# Patient Record
Sex: Male | Born: 1935 | Race: White | Hispanic: No | Marital: Married | Smoking: Never smoker
Health system: Southern US, Community
[De-identification: ages and names within clinical notes are randomized; demographics above are authoritative.]

## PROBLEM LIST (undated history)

## (undated) HISTORY — PX: PROSTATE BIOPSY: SHX241

---

## 2001-03-10 ENCOUNTER — Ambulatory Visit (HOSPITAL_COMMUNITY): Admission: RE | Admit: 2001-03-10 | Discharge: 2001-03-10 | Payer: Self-pay | Admitting: Orthopedic Surgery

## 2001-03-10 ENCOUNTER — Encounter: Payer: Self-pay | Admitting: Orthopedic Surgery

## 2004-08-28 ENCOUNTER — Ambulatory Visit: Payer: Self-pay | Admitting: Family Medicine

## 2004-12-09 ENCOUNTER — Ambulatory Visit: Payer: Self-pay | Admitting: Family Medicine

## 2004-12-24 ENCOUNTER — Ambulatory Visit: Payer: Self-pay | Admitting: Internal Medicine

## 2005-01-07 ENCOUNTER — Ambulatory Visit: Payer: Self-pay | Admitting: Internal Medicine

## 2005-01-07 LAB — HM COLONOSCOPY: HM Colonoscopy: ABNORMAL

## 2005-02-08 ENCOUNTER — Ambulatory Visit: Payer: Self-pay | Admitting: Internal Medicine

## 2005-08-03 ENCOUNTER — Ambulatory Visit: Payer: Self-pay | Admitting: Family Medicine

## 2005-10-04 ENCOUNTER — Ambulatory Visit: Payer: Self-pay | Admitting: Family Medicine

## 2006-08-17 ENCOUNTER — Ambulatory Visit: Payer: Self-pay | Admitting: Family Medicine

## 2007-08-18 ENCOUNTER — Ambulatory Visit: Payer: Self-pay | Admitting: Family Medicine

## 2008-07-23 ENCOUNTER — Ambulatory Visit: Payer: Self-pay | Admitting: Family Medicine

## 2008-08-06 ENCOUNTER — Encounter (INDEPENDENT_AMBULATORY_CARE_PROVIDER_SITE_OTHER): Payer: Self-pay | Admitting: Internal Medicine

## 2008-08-14 ENCOUNTER — Encounter (INDEPENDENT_AMBULATORY_CARE_PROVIDER_SITE_OTHER): Payer: Self-pay | Admitting: Internal Medicine

## 2008-08-14 DIAGNOSIS — R972 Elevated prostate specific antigen [PSA]: Secondary | ICD-10-CM | POA: Insufficient documentation

## 2008-08-29 ENCOUNTER — Encounter (INDEPENDENT_AMBULATORY_CARE_PROVIDER_SITE_OTHER): Payer: Self-pay | Admitting: Internal Medicine

## 2009-07-18 ENCOUNTER — Telehealth (INDEPENDENT_AMBULATORY_CARE_PROVIDER_SITE_OTHER): Payer: Self-pay | Admitting: Internal Medicine

## 2009-07-21 ENCOUNTER — Encounter (INDEPENDENT_AMBULATORY_CARE_PROVIDER_SITE_OTHER): Payer: Self-pay | Admitting: Internal Medicine

## 2009-07-22 ENCOUNTER — Ambulatory Visit: Payer: Self-pay | Admitting: Family Medicine

## 2009-07-22 DIAGNOSIS — K219 Gastro-esophageal reflux disease without esophagitis: Secondary | ICD-10-CM | POA: Insufficient documentation

## 2009-08-12 ENCOUNTER — Encounter (INDEPENDENT_AMBULATORY_CARE_PROVIDER_SITE_OTHER): Payer: Self-pay | Admitting: Internal Medicine

## 2009-09-10 ENCOUNTER — Encounter (INDEPENDENT_AMBULATORY_CARE_PROVIDER_SITE_OTHER): Payer: Self-pay | Admitting: Internal Medicine

## 2009-09-21 ENCOUNTER — Encounter (INDEPENDENT_AMBULATORY_CARE_PROVIDER_SITE_OTHER): Payer: Self-pay | Admitting: Internal Medicine

## 2010-01-08 ENCOUNTER — Ambulatory Visit: Payer: Self-pay | Admitting: Family Medicine

## 2010-01-08 DIAGNOSIS — R040 Epistaxis: Secondary | ICD-10-CM | POA: Insufficient documentation

## 2010-02-23 ENCOUNTER — Telehealth: Payer: Self-pay | Admitting: Family Medicine

## 2010-02-25 ENCOUNTER — Telehealth: Payer: Self-pay | Admitting: Family Medicine

## 2010-03-26 ENCOUNTER — Encounter: Payer: Self-pay | Admitting: Family Medicine

## 2010-06-23 ENCOUNTER — Telehealth (INDEPENDENT_AMBULATORY_CARE_PROVIDER_SITE_OTHER): Payer: Self-pay | Admitting: *Deleted

## 2010-06-24 ENCOUNTER — Telehealth: Payer: Self-pay | Admitting: Family Medicine

## 2010-09-08 ENCOUNTER — Ambulatory Visit: Payer: Self-pay | Admitting: Family Medicine

## 2010-11-17 NOTE — Assessment & Plan Note (Signed)
Summary: 30 MIN APPT/Jacob Gray'S PT/CLE   Vital Signs:  Patient profile:   77 year old male Height:      69.25 inches Weight:      182 pounds BMI:     26.78 Temp:     98.6 degrees F oral Pulse rate:   76 / minute Pulse rhythm:   regular BP sitting:   132 / 76  (left arm) Cuff size:   large  Vitals Entered By: Delilah Shan CMA Duncan Dull) (January 08, 2010 3:10 PM) CC: 30 min. appt. (BDB)   History of Present Illness: 77 yo very pleasant male new  to me here to discuss two issues.  1.  Nose bleeds- right side of nose bled every day last week. Would only last for a few minutes but would return every day. BP was always stable and never felt dizzy. Has been on Avodart, so stopped taking it due to nosebleed risk. Last nose bleed was Tuesday, which was after he stopped the Avodart. He wants to know if he should restart it.  2. GERD- used to be on Aciphex for several months to severe reflux. Would wake up 2 am every morning, coughing with burning in throat. Jacob Gray asked him stop taking it.  Last 3 weeks, started having these symptoms again. Tums not helping.  Current Medications (verified): 1)  Tums 500 Mg Chew (Calcium Carbonate Antacid) .... As Needed 2)  Aciphex 20 Mg Tbec (Rabeprazole Sodium) .... Take 1 Tab Every Morning  Allergies (verified): No Known Drug Allergies  Review of Systems      See HPI General:  Denies chills, fatigue, and fever. GI:  Complains of indigestion; denies abdominal pain, bloody stools, gas, hemorrhoids, loss of appetite, nausea, vomiting, vomiting blood, and yellowish skin color.  Physical Exam  General:  alert, well-developed, well-nourished, and well-hydrated.  Thin Nose:  right mucosal blood vessel, appears ruptured, not actively bleeding. Lungs:  normal respiratory effort, no intercostal retractions, no accessory muscle use, and normal breath sounds.   Heart:  normal rate, regular rhythm, and no murmur.   Abdomen:  soft, non-tender, normal bowel  sounds, no distention, no masses, no guarding, no abdominal hernia, no inguinal hernia, no hepatomegaly, and no splenomegaly.   Psych:  normally interactive and good eye contact.     Impression & Recommendations:  Problem # 1:  GERD (ICD-530.81) Assessment Deteriorated  Time spent with patient 25 minutes, more than 50% of this time was spent counseling patient on risks of prolonged PPI, H2 blocker use. We have samples of Dexliant which I gave him since most of his symptoms are waking him up at night. Also given a handout discussing risks of prolonged hypergastrinemia, the possible association of PPIs with gastric atrophy, and the effects of chronic hypochlorhydria. A possible increased risk of fractures prompted the FDA to recommend that healthcare professionals who prescribe proton pump inhibitors should consider whether a lower dose or shorter duration of therapy would adequately treat the patient's condition. He is very symptomatic at this point and I feel the risks outweigh the benefits.  Pt will try short course of dexliant and follow up with me in 1 month. The following medications were removed from the medication list:    Aciphex 20 Mg Tbec (Rabeprazole sodium) .Marland Kitchen... Take 1 tablet by mouth every morning His updated medication list for this problem includes:    Tums 500 Mg Chew (Calcium carbonate antacid) .Marland Kitchen... As needed    Aciphex 20 Mg Tbec (Rabeprazole sodium) .Marland Kitchen... Take  1 tab every morning  Orders: Prescription Created Electronically [phone])  Problem # 2:  NOSEBLEED (ICD-784.7) Assessment: New Appears stable.  Epistaxis appears in less than 0.2% of population taking Avodart.  Recommended restarting it and if nose bleeds recur, we can cauterize the vessel.  Complete Medication List: 1)  Tums 500 Mg Chew (Calcium carbonate antacid) .... As needed 2)  Aciphex 20 Mg Tbec (Rabeprazole sodium) .... Take 1 tab every morning Prescriptions: ACIPHEX 20 MG TBEC (RABEPRAZOLE SODIUM) Take  1 tab every morning  #90 x 3   Entered and Authorized by:   Ruthe Mannan MD   Signed by:   Ruthe Mannan MD on 01/08/2010   Method used:   Elect[facility]ically to        [facility]  [place] (retail)       [address]       [place], [place]  [address]       Ph: [phone] or [phone]       Fax: [phone]   RxID:   [id number]   Current Allergies (reviewed today): No known allergies

## 2010-11-17 NOTE — Letter (Signed)
Summary: [facility]  [facility]   Imported By: Lanelle Bal 04/08/2010 10:04:40  _____________________________________________________________________  External Attachment:    Type:   Image     Comment:   External Document

## 2010-11-17 NOTE — Progress Notes (Signed)
Summary: Rx Omeprazole  Phone Note Call from Patient Call back at [phone]   Caller: Patient Call For: Ruthe Mannan MD Summary of Call: Patient stopped by the office and left a request for a refill on his acid reflux medication. Called patient to verify which medication he is taking and was informed that he is now on Omeprazole which is working for him. Was informed by patient that the Nexium did not work for him. Patient would like a new rx sent to pharmacy for Omeprazole. [facility]acy/[facility]  Patient is aware that Dr. Dayton Martes is out today. Initial call taken by: Sydell Axon LPN,  June 24, 2010 12:29 PM    New/Updated Medications: OMEPRAZOLE 20 MG CPDR (OMEPRAZOLE) one by mouth daily Prescriptions: OMEPRAZOLE 20 MG CPDR (OMEPRAZOLE) one by mouth daily  #90 x 3   Entered and Authorized by:   Ruthe Mannan MD   Signed by:   Ruthe Mannan MD on 06/24/2010   Method used:   Electronically to        [facility]* (retail)       [address]       [place], [place]  [address]       Ph: [phone]       Fax: [phone]   RxID:   [id number]

## 2010-11-17 NOTE — Assessment & Plan Note (Signed)
Summary: FLU SHOT/CLE  Nurse Visit   Allergies: No Known Drug Allergies  Orders Added: 1)  Admin 1st Vaccine [90471] 2)  Flu Vaccine 9yrs + [74259]                  Flu Vaccine Consent Questions     Do you have a history of severe allergic reactions to this vaccine? no    Any prior history of allergic reactions to egg and/or gelatin? no    Do you have a sensitivity to the preservative Thimersol? no    Do you have a past history of Guillan-Barre Syndrome? no    Do you currently have an acute febrile illness? no    Have you ever had a severe reaction to latex? no    Vaccine information given and explained to patient? yes    Are you currently pregnant? no    Lot Number:[id number]   Exp Date:04/17/2011   Site Given  Left Deltoid IMu1

## 2010-11-17 NOTE — Progress Notes (Signed)
Summary: Prior Authorization Aciphex  Phone Note From Pharmacy Call back at ph [phone] fax [phone]   Caller: [facility]* Call For: Dr. Dayton Martes  Summary of Call: Received faxed form from pharmacy stating that PA is needed for Aciphex 20mg .  Called [employer] at [phone] to request PA form, case ID [id number].  Linde Gillis CMA Duncan Dull)  Feb 23, 2010 3:41 PM   Received PA form, in your IN box.   Initial call taken by: Linde Gillis CMA Duncan Dull),  Feb 23, 2010 4:40 PM  Follow-up for Phone Call        In my box. Ruthe Mannan MD  Feb 24, 2010 8:06 AM  Form faxed to [employer] at [phone].    Follow-up by: Linde Gillis CMA Duncan Dull),  Feb 24, 2010 8:35 AM     Appended Document: Prior Authorization Aciphex Received Denial for Aciphex.  Nexium and Omeprazole is covered under the patients plan.  Denial letter in your IN box.    Appended Document: Prior Authorization Aciphex Nexium sent in to [facility]. thanks, talia  Appended Document: Prior Authorization Aciphex Patient's spouse notified as instructed via telephone.

## 2010-11-17 NOTE — Progress Notes (Signed)
Summary: change aciphex?  Phone Note From Pharmacy   Caller: [facility]* Action Taken: Phone call completed Summary of Call: Pharmacy is asking to change aciphex to nexium, which is covered by his insurance. Still waiting for [employer]'s answer to prior auth for aciphex.  Please advise. Initial call taken by: Lowella Petties CMA,  Feb 25, 2010 3:48 PM  Follow-up for Phone Call        [facility] to give 30 nexium with no refills. Follow-up by: Ruthe Mannan MD,  Feb 25, 2010 3:55 PM  Additional Follow-up for Phone Call Additional follow up Details #1::        Rob is going to dispense prilosec,  if ok with you.      Lowella Petties CMA  Feb 25, 2010 4:26 PM Thats fine. Ruthe Mannan MD  Feb 25, 2010 4:36 PM

## 2010-11-17 NOTE — Progress Notes (Signed)
Summary: dixilant not working well  Phone Note Call from Patient Call back at [facility] Phone [phone] Call back at [phone]   Caller: Patient Call For: Dr. Dayton Martes Summary of Call: Pt is almost out of dexilant samples.  He doesnt think these have worked as well as the aciphex.  He is asking if you want to try something different. Initial call taken by: Lowella Petties CMA,  Feb 23, 2010 2:01 PM  Follow-up for Phone Call        Does he want to go back on the Aciphex? Ruthe Mannan MD  Feb 23, 2010 2:06 PM  Yes unless you would like for him to try something different.  Please send Rx. to [facility].  Lugene Fuquay CMA (AAMA)  Feb 23, 2010 2:25 PM      Prescriptions: ACIPHEX 20 MG TBEC (RABEPRAZOLE SODIUM) Take 1 tab every morning  #90 x 3   Entered and Authorized by:   Ruthe Mannan MD   Signed by:   Ruthe Mannan MD on 02/23/2010   Method used:   Electronically to        [facility]* (retail)       [address]       [place], [place]  [address]       Ph: [phone]       Fax: [phone]   RxID:   [id number]

## 2011-04-06 ENCOUNTER — Other Ambulatory Visit: Payer: Self-pay | Admitting: *Deleted

## 2011-04-06 NOTE — Telephone Encounter (Signed)
Opened in error

## 2011-04-13 ENCOUNTER — Telehealth: Payer: Self-pay | Admitting: *Deleted

## 2011-04-13 NOTE — Telephone Encounter (Signed)
Prior auth is needed for omeprazole, form is on your desk. 

## 2011-04-14 NOTE — Telephone Encounter (Signed)
Form faxed back to [employer]. 

## 2011-04-14 NOTE — Telephone Encounter (Signed)
Prior auth given for omeprazole, advised pharmacy, approval letter placed on doctor's desk for signature and scanning. 

## 2011-04-14 NOTE — Telephone Encounter (Signed)
In my box

## 2011-07-13 ENCOUNTER — Other Ambulatory Visit: Payer: Self-pay | Admitting: *Deleted

## 2011-07-13 MED ORDER — OMEPRAZOLE 20 MG PO CPDR: 20.0000 mg | DELAYED_RELEASE_CAPSULE | Freq: Every day | ORAL | Status: DC

## 2011-07-21 ENCOUNTER — Encounter: Payer: Self-pay | Admitting: Family Medicine

## 2011-07-22 ENCOUNTER — Ambulatory Visit (INDEPENDENT_AMBULATORY_CARE_PROVIDER_SITE_OTHER): Payer: Medicare Other | Admitting: Family Medicine

## 2011-07-22 ENCOUNTER — Encounter: Payer: Self-pay | Admitting: Family Medicine

## 2011-07-22 VITALS — BP 120/80 | HR 61 | Temp 98.3°F | Ht 72.0 in | Wt 174.0 lb

## 2011-07-22 DIAGNOSIS — K219 Gastro-esophageal reflux disease without esophagitis: Secondary | ICD-10-CM

## 2011-07-22 NOTE — Progress Notes (Signed)
77 yo here for med refills.  GERD-  Currently taking Prilosec 20 mg daily, symptoms controlled. Not waking up at 2 am every morning, coughing with burning in throat like he used to.   Tries to skip taking it every now and then.   Very active- plays 80 holes of golf per week. Walks daily.  Patient Active Problem List  Diagnoses  . GERD  . NOSEBLEED  . PSA, INCREASED   No past medical history on file. Past Surgical History  Procedure Date  . Prostate biopsy    History  Substance Use Topics  . Smoking status: Not on file  . Smokeless tobacco: Not on file  . Alcohol Use:    No family history on file. Allergies not on file Current Outpatient Prescriptions on File Prior to Visit  Medication Sig Dispense Refill  . calcium carbonate (TUMS - DOSED IN MG ELEMENTAL CALCIUM) 500 MG chewable tablet Chew 1 tablet by mouth daily.        Marland Kitchen esomeprazole (NEXIUM) 40 MG capsule Take 40 mg by mouth daily before breakfast.        . omeprazole (PRILOSEC) 20 MG capsule Take 1 capsule (20 mg total) by mouth daily.  30 capsule  0   The PMH, PSH, Social History, Family History, Medications, and allergies have been reviewed in [facility], and have been updated if relevant.  Review of Systems  See HPI  General: Denies chills, fatigue, and fever.  GI: Complains of indigestion; denies abdominal pain, bloody stools, gas, hemorrhoids, loss of appetite, nausea, vomiting, vomiting blood, and yellowish skin color.  Physical Exam   BP 120/80  Pulse 61  Temp(Src) 98.3 F (36.8 C) (Oral)  Ht 6' (1.829 m)  Wt 174 lb (78.926 kg)  BMI 23.60 kg/m2  General: alert, well-developed, well-nourished, and well-hydrated. Thin  Nose: right mucosal blood vessel, appears ruptured, not actively bleeding.  Lungs: normal respiratory effort, no intercostal retractions, no accessory muscle use, and normal breath sounds.  Heart: normal rate, regular rhythm, and no murmur.  Abdomen: soft, non-tender, normal bowel sounds, no  distention, no masses, no guarding, no abdominal hernia, no inguinal hernia, no hepatomegaly, and no splenomegaly.  Psych: normally interactive and good eye contact.   Impression & Recommendations:  .  1. GERD   Improved.  Prilosec refilled. Discussed continued lifestyle management to help with GERD- handout given.

## 2011-08-16 ENCOUNTER — Other Ambulatory Visit: Payer: Self-pay | Admitting: *Deleted

## 2011-08-16 MED ORDER — OMEPRAZOLE 20 MG PO CPDR: 20.0000 mg | DELAYED_RELEASE_CAPSULE | Freq: Every day | ORAL | Status: DC

## 2011-08-17 ENCOUNTER — Telehealth: Payer: Self-pay | Admitting: *Deleted

## 2011-08-17 NOTE — Telephone Encounter (Signed)
Patient came in the office today stating that he needs a Rx refill on his acid reflux medication.  Called Beth at [facility] and she stated that patient takes Prilosec 20mg  one tablet daily.  Authorized #30 with 11 refills.  I will remove Nexium from medication list.

## 2011-11-17 DIAGNOSIS — IMO0002 Reserved for concepts with insufficient information to code with codable children: Secondary | ICD-10-CM | POA: Insufficient documentation

## 2011-11-17 DIAGNOSIS — H04123 Dry eye syndrome of bilateral lacrimal glands: Secondary | ICD-10-CM | POA: Insufficient documentation

## 2012-08-21 ENCOUNTER — Other Ambulatory Visit: Payer: Self-pay | Admitting: *Deleted

## 2012-08-21 MED ORDER — OMEPRAZOLE 20 MG PO CPDR: 20.0000 mg | DELAYED_RELEASE_CAPSULE | Freq: Every day | ORAL | Status: DC

## 2012-08-24 NOTE — Telephone Encounter (Signed)
Called to [facility]. 

## 2012-09-25 ENCOUNTER — Other Ambulatory Visit: Payer: Self-pay

## 2012-09-25 NOTE — Telephone Encounter (Signed)
Pt left note requesting refill omeprazole. Pt given # 30 08/21/12 with note to call for appt. No future appt scheduled.I called pts contact # and a lady said pt was not available and she would tell him pt call back.Please advise.

## 2012-09-25 NOTE — Telephone Encounter (Signed)
Rx denied until further appts

## 2012-09-25 NOTE — Telephone Encounter (Signed)
Pt called back and advised needed to be seen before more refills. Pt said he had one pill left. Pt scheduled appt 09/26/12 at 10:30 am.

## 2012-09-26 ENCOUNTER — Encounter: Payer: Self-pay | Admitting: Family Medicine

## 2012-09-26 ENCOUNTER — Ambulatory Visit (INDEPENDENT_AMBULATORY_CARE_PROVIDER_SITE_OTHER): Payer: Medicare Other | Admitting: Family Medicine

## 2012-09-26 VITALS — BP 152/88 | HR 60 | Temp 98.1°F | Wt 177.0 lb

## 2012-09-26 DIAGNOSIS — K219 Gastro-esophageal reflux disease without esophagitis: Secondary | ICD-10-CM

## 2012-09-26 DIAGNOSIS — Z136 Encounter for screening for cardiovascular disorders: Secondary | ICD-10-CM

## 2012-09-26 DIAGNOSIS — Z0000 Encounter for general adult medical examination without abnormal findings: Secondary | ICD-10-CM

## 2012-09-26 LAB — COMPREHENSIVE METABOLIC PANEL
ALT: 16 U/L (ref 0–53)
Alkaline Phosphatase: 56 U/L (ref 39–117)
Creatinine, Ser: 0.9 mg/dL (ref 0.4–1.5)
Sodium: 137 mEq/L (ref 135–145)
Total Bilirubin: 0.5 mg/dL (ref 0.3–1.2)
Total Protein: 7.1 g/dL (ref 6.0–8.3)

## 2012-09-26 LAB — LIPID PANEL
HDL: 37.1 mg/dL — ABNORMAL LOW (ref >39.00)
LDL Cholesterol: 106 mg/dL — ABNORMAL HIGH (ref 0–99)
VLDL: 30.4 mg/dL (ref 0.0–40.0)

## 2012-09-26 MED ORDER — OMEPRAZOLE 20 MG PO CPDR: 20.0000 mg | DELAYED_RELEASE_CAPSULE | Freq: Every day | ORAL | Status: DC

## 2012-09-26 NOTE — Progress Notes (Signed)
78 yo here for med refills.  Has not had labs drawn here since 2010.  No CPX since [employer] retired.  States that he does not like coming to doctor's office.  Sees urology ([facility])- last PSA 6 months ago.  Right knee has been bothering him- seeing Dr. Thomasena Edis (ortho).  GERD-  Currently taking Omeprazole 20 mg daily, symptoms controlled. Not waking up at 2 am every morning, coughing with burning in throat like he used to.    Very active- plays 80 holes of golf per week. Walks daily.  Patient Active Problem List  Diagnosis  . GERD  . NOSEBLEED  . PSA, INCREASED   No past medical history on file. Past Surgical History  Procedure Date  . Prostate biopsy    History  Substance Use Topics  . Smoking status: Never Smoker   . Smokeless tobacco: Not on file  . Alcohol Use: Not on file   No family history on file. No Known Allergies Current Outpatient Prescriptions on File Prior to Visit  Medication Sig Dispense Refill  . calcium carbonate (TUMS - DOSED IN MG ELEMENTAL CALCIUM) 500 MG chewable tablet Chew 1 tablet by mouth daily.        Marland Kitchen dutasteride (AVODART) 0.5 MG capsule Take 0.5 mg by mouth daily.        Marland Kitchen omeprazole (PRILOSEC) 20 MG capsule Take 1 capsule (20 mg total) by mouth daily. *Needs appointment with Dr within 1 month  for any further refills*  30 capsule  0   The PMH, PSH, Social History, Family History, Medications, and allergies have been reviewed in [facility], and have been updated if relevant.  Review of Systems  See HPI  General: Denies chills, fatigue, and fever.  GI: Complains of indigestion; denies abdominal pain, bloody stools, gas, hemorrhoids, loss of appetite, nausea, vomiting, vomiting blood, and yellowish skin color.  Physical Exam   BP 152/88  Pulse 60  Temp 98.1 F (36.7 C)  Wt 177 lb (80.287 kg)  General: alert, well-developed, well-nourished, and well-hydrated. Thin  Nose: right mucosal blood vessel, appears ruptured, not actively bleeding.   Lungs: normal respiratory effort, no intercostal retractions, no accessory muscle use, and normal breath sounds.  Heart: normal rate, regular rhythm, and no murmur.  Abdomen: soft, non-tender, normal bowel sounds, no distention, no masses, no guarding, no abdominal hernia, no inguinal hernia, no hepatomegaly, and no splenomegaly.  Psych: normally interactive and good eye contact.   Impression & Recommendations:  .  1. GERD   Stable with current dose of Omeprazole. Discussed continued lifestyle management to help with GERD- handout given.  Orders Placed This Encounter  Procedures  . Comprehensive metabolic panel  . Lipid Panel

## 2012-09-26 NOTE — Patient Instructions (Addendum)
Good to see you. We will call you with your lab results.   

## 2012-09-27 ENCOUNTER — Encounter: Payer: Self-pay | Admitting: *Deleted

## 2012-12-27 DIAGNOSIS — H02403 Unspecified ptosis of bilateral eyelids: Secondary | ICD-10-CM | POA: Insufficient documentation

## 2012-12-27 DIAGNOSIS — H04209 Unspecified epiphora, unspecified lacrimal gland: Secondary | ICD-10-CM | POA: Insufficient documentation

## 2013-08-23 ENCOUNTER — Other Ambulatory Visit: Payer: Self-pay

## 2013-10-01 ENCOUNTER — Other Ambulatory Visit: Payer: Self-pay | Admitting: Family Medicine

## 2013-10-22 ENCOUNTER — Encounter: Payer: Self-pay | Admitting: Family Medicine

## 2013-10-22 ENCOUNTER — Ambulatory Visit (INDEPENDENT_AMBULATORY_CARE_PROVIDER_SITE_OTHER): Payer: Medicare Other | Admitting: Family Medicine

## 2013-10-22 VITALS — BP 122/66 | HR 80 | Temp 98.4°F | Ht 70.0 in | Wt 175.5 lb

## 2013-10-22 DIAGNOSIS — R972 Elevated prostate specific antigen [PSA]: Secondary | ICD-10-CM

## 2013-10-22 DIAGNOSIS — K219 Gastro-esophageal reflux disease without esophagitis: Secondary | ICD-10-CM

## 2013-10-22 MED ORDER — OMEPRAZOLE 20 MG PO CPDR: DELAYED_RELEASE_CAPSULE | ORAL | Status: DC

## 2013-10-22 NOTE — Progress Notes (Signed)
Pre-visit discussion using our clinic review tool. No additional management support is needed unless otherwise documented below in the visit note.  

## 2013-10-22 NOTE — Assessment & Plan Note (Signed)
Stable on current rx. Refilled. He will schedule CPX after cold and flu season is over.

## 2013-10-22 NOTE — Assessment & Plan Note (Signed)
Followed by urology.   

## 2013-10-22 NOTE — Progress Notes (Signed)
80 yo here for med refills.   No CPX since [employer] retired.  States that he does not like coming to doctor's office.    Sees urology ([facility])- last PSA a few months ago per pt.  No results found for this basename: PSA     GERD-  Currently taking Omeprazole 20 mg daily, symptoms controlled. Not waking up with coughing with burning in throat like he used to.    He would like this refilled today- why he is here.  Very active- plays 100 holes of golf per week. Walks daily. Denies CP or SOB.  Lab Results  Component Value Date   CREATININE 0.9 09/26/2012     Lab Results  Component Value Date   CHOL 173 09/26/2012   HDL 37.10* 09/26/2012   LDLCALC 106* 09/26/2012   TRIG 152.0* 09/26/2012   CHOLHDL 5 09/26/2012    Patient Active Problem List   Diagnosis Date Noted  . GERD 07/22/2009  . PSA, INCREASED 08/14/2008   No past medical history on file. Past Surgical History  Procedure Laterality Date  . Prostate biopsy     History  Substance Use Topics  . Smoking status: Never Smoker   . Smokeless tobacco: Not on file  . Alcohol Use: Not on file   No family history on file. No Known Allergies Current Outpatient Prescriptions on File Prior to Visit  Medication Sig Dispense Refill  . dutasteride (AVODART) 0.5 MG capsule Take 0.5 mg by mouth daily.         No current facility-administered medications on file prior to visit.   The PMH, PSH, Social History, Family History, Medications, and allergies [facility] been reviewed [facility], and have been updated if relevant.  Review of Systems  See HPI  General: Denies chills, fatigue, and fever.  GI: Complains of indigestion; denies abdominal pain, bloody stools, gas, hemorrhoids, loss of appetite, nausea, vomiting, vomiting blood, and yellowish skin color.  Physical Exam   BP 122/66  Pulse 80  Temp(Src) 98.4 F (36.9 C) (Oral)  Ht 5\' 10"  (1.778 m)  Wt 175 lb 8 oz (79.606 kg)  BMI 25.18 kg/m2  SpO2 95%  General: alert,  well-developed, well-nourished, and well-hydrated. Thin  Nose: right mucosal blood vessel, appears ruptured, not actively bleeding.  Lungs: normal respiratory effort, no intercostal retractions, no accessory muscle use, and normal breath sounds.  Heart: normal rate, regular rhythm, and no murmur.  Abdomen: soft, non-tender, normal bowel sounds, no distention, no masses, no guarding, no abdominal hernia, no inguinal hernia, no hepatomegaly, and no splenomegaly.  Psych: normally interactive and good eye contact.   Impression & Recommendations:

## 2013-10-23 ENCOUNTER — Telehealth: Payer: Self-pay

## 2013-10-23 NOTE — Telephone Encounter (Signed)
Pt request mchart activation code; unable to generate a code pt will call mychart assistance.

## 2014-04-29 ENCOUNTER — Other Ambulatory Visit: Payer: Self-pay | Admitting: Family Medicine

## 2014-07-01 ENCOUNTER — Encounter: Payer: Self-pay | Admitting: Family Medicine

## 2014-07-01 ENCOUNTER — Ambulatory Visit (INDEPENDENT_AMBULATORY_CARE_PROVIDER_SITE_OTHER): Payer: Medicare Other | Admitting: Family Medicine

## 2014-07-01 VITALS — BP 122/74 | HR 62 | Temp 98.0°F | Resp 16 | Ht 72.0 in | Wt 172.1 lb

## 2014-07-01 DIAGNOSIS — M7021 Olecranon bursitis, right elbow: Secondary | ICD-10-CM

## 2014-07-01 DIAGNOSIS — M7020 Olecranon bursitis, unspecified elbow: Secondary | ICD-10-CM

## 2014-07-01 NOTE — Progress Notes (Signed)
Pre visit review using our clinic review tool, if applicable. No additional management support is needed unless otherwise documented below in the visit note.  R elbow puffy.  Going on about 1 week.  Not bigger in the last few days.  "It came up and it's been like that."  No trauma.  Not red, painful, tender.  No fevers.  No drainage.   Meds, vitals, and allergies reviewed.   ROS: See HPI.  Otherwise, noncontributory.  nad Normal ROM at the R elbow shoulder and wrist Slightly puffy R olecranon bursa, but not ttp, not tight, not red.  No bruising.  Distally nv intact

## 2014-07-01 NOTE — Patient Instructions (Signed)
Olecranon bursitis.  Limit pressure on the back of the elbow- no curls on a bench.  If it gets a lot puffier or redder/painful, then notify us.  If not better at all, then ask about seeing Dr. Patsy Lager. Take care.

## 2014-07-02 DIAGNOSIS — M7021 Olecranon bursitis, right elbow: Secondary | ICD-10-CM | POA: Insufficient documentation

## 2014-07-02 NOTE — Assessment & Plan Note (Signed)
Doesn't look infected, reassured, d/w pt. Limit motion that would compress the area, ie curls with elbows resting on a bench.  See instructions. Wouldn't intervene at this point.  D/w pt.

## 2014-07-29 ENCOUNTER — Ambulatory Visit (INDEPENDENT_AMBULATORY_CARE_PROVIDER_SITE_OTHER): Payer: Medicare Other | Admitting: Family Medicine

## 2014-07-29 ENCOUNTER — Telehealth: Payer: Self-pay | Admitting: Family Medicine

## 2014-07-29 ENCOUNTER — Encounter: Payer: Self-pay | Admitting: Family Medicine

## 2014-07-29 VITALS — BP 116/70 | HR 71 | Temp 98.1°F | Ht 70.0 in | Wt 174.5 lb

## 2014-07-29 DIAGNOSIS — S91209A Unspecified open wound of unspecified toe(s) with damage to nail, initial encounter: Secondary | ICD-10-CM

## 2014-07-29 DIAGNOSIS — M7021 Olecranon bursitis, right elbow: Secondary | ICD-10-CM

## 2014-07-29 MED ORDER — METHYLPREDNISOLONE ACETATE 40 MG/ML IJ SUSP
20.0000 mg | Freq: Once | INTRAMUSCULAR | Status: AC
  Administered 2014-07-29: 20 mg via INTRA_ARTICULAR

## 2014-07-29 NOTE — Telephone Encounter (Signed)
emmi emailed °

## 2014-07-29 NOTE — Progress Notes (Signed)
   Dr. Karleen HampshireSpencer T. Brittny Spangle, MD, CAQ Sports Medicine Primary [facility] and Sports Medicine [address] [place], [id number] Phone: [phone] Fax: [phone]  07/29/2014  Patient: Jacob Gray, MRN: [id number], DOB: June 11, 1934, 80 y.o.  Primary Physician:  Ruthe Mannanalia Aron, MD  Chief Complaint: Elbow Pain  Subjective:   Jacob ScottWilliam B Isadore is a 80 y.o. very pleasant male patient who presents with the following:  Right elbow: 4-6 weeks olecranon bursitis Not tender. No redness or warmth. No trauma or any inciting injury or event to the elbow.   He also recently traumatically avulsed about 60% of his great toenail  Past Medical History, Surgical History, Social History, Family History, Problem List, Medications, and Allergies have been reviewed and updated if relevant.  GEN: No fevers, chills. Nontoxic. Primarily MSK c/o today. MSK: Detailed in the HPI GI: tolerating PO intake without difficulty Neuro: No numbness, parasthesias, or tingling associated. Otherwise the pertinent positives of the ROS are noted above.   Objective:   BP 116/70  Pulse 71  Temp(Src) 98.1 F (36.7 C) (Oral)  Ht 5\' 10"  (1.778 m)  Wt 174 lb 8 oz (79.153 kg)  BMI 25.04 kg/m2   GEN: WDWN, NAD, Non-toxic, Alert & Oriented x 3 HEENT: Atraumatic, Normocephalic.  Ears and Nose: No external deformity. EXTR: No clubbing/cyanosis/edema NEURO: Normal gait.  PSYCH: Normally interactive. Conversant. Not depressed or anxious appearing.  Calm demeanor.   R elbow Ecchymosis or edema: neg ROM: full flexion, extension, pronation, supination LARGE BOGGY OLECRANON BURSA Shoulder ROM: Full Flexion: 5/5 Extension: 5/5 Supination: 5/5  Pronation: 5/5 Wrist ext: 5/5 Wrist flexion: 5/5 No gross bony abnormality Varus and Valgus stress: stable ECRB tenderness: neg Medial epicondyle: NT Lateral epicondyle, resisted wrist extension from wrist full pronation and flexion: NT grip: 5/5  sensation  intact Tinel's, Elbow: negative   Radiology: No results found.  Assessment and Plan:   Olecranon bursitis of right elbow  Avulsion of toenail, initial encounter  Olecranon bursitis: Discussed the anatomy involved. Often from trauma or mechanical irritation.   Consideration of aspiration is reasonable. Discussed with the patient that aspiration and injection has risk of infection. There is also a significant failure rate. Bursectomy is the definitive surgical treatment if the patient does poorly long term.  ACE bandage for compression over the next week. Elbow pad for any repetitive motion resting at the elbow.   Olecranon Bursa Aspiration, RIGHT Verbal consent was obtained. Risks, benefits, and alternatives discussed. Potential complications including loss of pigment and atrophy were discussed and risk of infection. Prepped with Chloraprep and Ethyl Chloride used for anesthesia. Under sterile conditions, the effected olecranon bursa was aspirated with an 18 gauge needle. 30 mL amount of serosanguinous fluid was aspirated. Depo-medrol 20 mg was injected in 1/2 cc of lidocaine 1%.   A compression bandage was applied and the patient was instructed to continue for 72 hours.   Signed,  Elpidio GaleaSpencer T. Shaw Dobek, MD   Patient's Medications  New Prescriptions   No medications on file  Previous Medications   DUTASTERIDE (AVODART) 0.5 MG CAPSULE    Take 0.5 mg by mouth daily.     OMEPRAZOLE (PRILOSEC) 20 MG CAPSULE    TAKE ONE CAPSULE BY MOUTH DAILY  Modified Medications   No medications on file  Discontinued Medications   No medications on file

## 2014-07-29 NOTE — Addendum Note (Signed)
Addended by: Damita LackLORING, Jameis Newsham S on: 07/29/2014 02:31 PM   Modules accepted: Orders

## 2014-07-29 NOTE — Progress Notes (Signed)
Pre visit review using our clinic review tool, if applicable. No additional management support is needed unless otherwise documented below in the visit note. 

## 2014-10-01 ENCOUNTER — Other Ambulatory Visit: Payer: Self-pay | Admitting: Family Medicine

## 2014-11-04 ENCOUNTER — Other Ambulatory Visit: Payer: Self-pay | Admitting: Family Medicine

## 2015-01-06 ENCOUNTER — Encounter: Payer: Self-pay | Admitting: Internal Medicine

## 2015-04-14 ENCOUNTER — Other Ambulatory Visit: Payer: Self-pay

## 2015-05-27 ENCOUNTER — Encounter: Payer: Self-pay | Admitting: Internal Medicine

## 2015-09-16 ENCOUNTER — Ambulatory Visit (INDEPENDENT_AMBULATORY_CARE_PROVIDER_SITE_OTHER): Payer: Medicare Other | Admitting: Family Medicine

## 2015-09-16 ENCOUNTER — Encounter: Payer: Self-pay | Admitting: Family Medicine

## 2015-09-16 VITALS — BP 128/70 | HR 72 | Temp 98.9°F | Ht 70.0 in | Wt 180.0 lb

## 2015-09-16 DIAGNOSIS — R21 Rash and other nonspecific skin eruption: Secondary | ICD-10-CM

## 2015-09-16 MED ORDER — TRIAMCINOLONE ACETONIDE 0.5 % EX CREA: 1.0000 "application " | TOPICAL_CREAM | Freq: Two times a day (BID) | CUTANEOUS | Status: DC

## 2015-09-16 MED ORDER — TRIAMCINOLONE ACETONIDE 0.5 % EX CREA: 1.0000 "application " | TOPICAL_CREAM | Freq: Three times a day (TID) | CUTANEOUS | Status: DC

## 2015-09-16 NOTE — Patient Instructions (Signed)
Apply steroid cream to affected area twice daily. Call if not improving in 2 week, earlier if feeling ill or rash worsening.

## 2015-09-16 NOTE — Progress Notes (Signed)
Pre visit review using our clinic review tool, if applicable. No additional management support is needed unless otherwise documented below in the visit note. 

## 2015-09-16 NOTE — Progress Notes (Signed)
   Subjective:    Patient ID: Jacob Gray, male    DOB: 1934/05/01, 81 y.o.   MRN: [id number]  HPI   81 year old healthy male pt of Dr. Elmer SowAron's presents for new onset rash pon arms after working with treated lumbar in last 4 days.  24 hours later noted red spots on palms, yesterday spread to other hand, down arms, on elbows. Not itchy, skin is sensitive to touch. Red, warm to touh.  No fever.  No ill feeling.    Social History /Family History/Past Medical History reviewed and updated if needed.  No skin problems in history.    Review of Systems  Constitutional: Negative for fever and fatigue.  HENT: Negative for ear pain.   Eyes: Negative for pain.  Respiratory: Negative for cough and shortness of breath.   Cardiovascular: Negative for chest pain.       Objective:   Physical Exam  Constitutional: Vital signs are normal. He appears well-developed and well-nourished.  HENT:  Head: Normocephalic.  Right Ear: Hearing normal.  Left Ear: Hearing normal.  Nose: Nose normal.  Mouth/Throat: Oropharynx is clear and moist and mucous membranes are normal.  Neck: Trachea normal. Carotid bruit is not present. No thyroid mass and no thyromegaly present.  Cardiovascular: Normal rate, regular rhythm and normal pulses.  Exam reveals no gallop, no distant heart sounds and no friction rub.   No murmur heard. No peripheral edema  Pulmonary/Chest: Effort normal and breath sounds normal. No respiratory distress.  Skin: Skin is warm, dry and intact. No rash noted.  Erythematous raised HOT plaques and macules on palms , wrists and elbow.  Possible early blisters, no pustules  Psychiatric: He has a normal mood and affect. His speech is normal and behavior is normal. Thought content normal.          Assessment & Plan:

## 2015-09-16 NOTE — Assessment & Plan Note (Signed)
Unclear etiology. Not clearly infectious. ?  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   ? Contact derm. Treat with topical steroid.. If not improving or becomes more bullous consider biopsy or referral to derm. Pt feel well otherwise.

## 2015-09-29 ENCOUNTER — Telehealth: Payer: Self-pay | Admitting: Family Medicine

## 2015-09-29 NOTE — Telephone Encounter (Signed)
Pt stopped by the office to make medicare wellness appt and wanted to speak with Dr. Dayton MartesAron about his dermatology appointment.  He is requesting CB on his cell #[phone].

## 2015-09-29 NOTE — Telephone Encounter (Signed)
This pt has not seen Dr Dayton MartesAron since 2013

## 2015-09-30 ENCOUNTER — Other Ambulatory Visit: Payer: Self-pay | Admitting: Family Medicine

## 2015-09-30 DIAGNOSIS — Z0000 Encounter for general adult medical examination without abnormal findings: Secondary | ICD-10-CM

## 2015-09-30 DIAGNOSIS — R972 Elevated prostate specific antigen [PSA]: Secondary | ICD-10-CM

## 2015-10-02 ENCOUNTER — Other Ambulatory Visit (INDEPENDENT_AMBULATORY_CARE_PROVIDER_SITE_OTHER): Payer: Medicare Other

## 2015-10-02 DIAGNOSIS — Z0000 Encounter for general adult medical examination without abnormal findings: Secondary | ICD-10-CM | POA: Diagnosis not present

## 2015-10-02 DIAGNOSIS — R972 Elevated prostate specific antigen [PSA]: Secondary | ICD-10-CM | POA: Diagnosis not present

## 2015-10-02 LAB — CBC WITH DIFFERENTIAL/PLATELET
BASOS PCT: 0.5 % (ref 0.0–3.0)
Basophils Absolute: 0 10*3/uL (ref 0.0–0.1)
EOS ABS: 0 10*3/uL (ref 0.0–0.7)
Eosinophils Relative: 0.5 % (ref 0.0–5.0)
HCT: 43 % (ref 39.0–52.0)
HEMOGLOBIN: 14 g/dL (ref 13.0–17.0)
LYMPHS ABS: 3 10*3/uL (ref 0.7–4.0)
Lymphocytes Relative: 32.1 % (ref 12.0–46.0)
MCHC: 32.5 g/dL (ref 30.0–36.0)
MCV: 87.2 fl (ref 78.0–100.0)
MONO ABS: 0.6 10*3/uL (ref 0.1–1.0)
Monocytes Relative: 6.2 % (ref 3.0–12.0)
NEUTROS PCT: 60.7 % (ref 43.0–77.0)
Neutro Abs: 5.8 10*3/uL (ref 1.4–7.7)
Platelets: 332 10*3/uL (ref 150.0–400.0)
RBC: 4.94 Mil/uL (ref 4.22–5.81)
RDW: 13.8 % (ref 11.5–15.5)
WBC: 9.5 10*3/uL (ref 4.0–10.5)

## 2015-10-02 LAB — COMPREHENSIVE METABOLIC PANEL
ALBUMIN: 3.6 g/dL (ref 3.5–5.2)
ALT: 16 U/L (ref 0–53)
AST: 11 U/L (ref 0–37)
Alkaline Phosphatase: 66 U/L (ref 39–117)
BUN: 18 mg/dL (ref 6–23)
CHLORIDE: 103 meq/L (ref 96–112)
CO2: 30 mEq/L (ref 19–32)
CREATININE: 0.85 mg/dL (ref 0.40–1.50)
Calcium: 9.4 mg/dL (ref 8.4–10.5)
GFR: 92.33 mL/min (ref >60.00)
GLUCOSE: 150 mg/dL — AB (ref 70–99)
Potassium: 4.6 mEq/L (ref 3.5–5.1)
SODIUM: 139 meq/L (ref 135–145)
Total Bilirubin: 0.4 mg/dL (ref 0.2–1.2)
Total Protein: 6.2 g/dL (ref 6.0–8.3)

## 2015-10-02 LAB — LIPID PANEL
CHOL/HDL RATIO: 4
CHOLESTEROL: 171 mg/dL (ref 0–200)
HDL: 42.8 mg/dL (ref >39.00)
LDL CALC: 108 mg/dL — AB (ref 0–99)
NONHDL: 128.49
Triglycerides: 100 mg/dL (ref 0.0–149.0)
VLDL: 20 mg/dL (ref 0.0–40.0)

## 2015-10-02 LAB — PSA: PSA: 2 ng/mL (ref 0.10–4.00)

## 2015-10-07 ENCOUNTER — Ambulatory Visit (INDEPENDENT_AMBULATORY_CARE_PROVIDER_SITE_OTHER): Payer: Medicare Other | Admitting: Family Medicine

## 2015-10-07 ENCOUNTER — Encounter: Payer: Self-pay | Admitting: Family Medicine

## 2015-10-07 VITALS — BP 128/68 | HR 76 | Temp 98.3°F | Ht 70.0 in | Wt 172.5 lb

## 2015-10-07 DIAGNOSIS — R21 Rash and other nonspecific skin eruption: Secondary | ICD-10-CM | POA: Diagnosis not present

## 2015-10-07 DIAGNOSIS — R739 Hyperglycemia, unspecified: Secondary | ICD-10-CM

## 2015-10-07 DIAGNOSIS — Z0000 Encounter for general adult medical examination without abnormal findings: Secondary | ICD-10-CM

## 2015-10-07 DIAGNOSIS — Z23 Encounter for immunization: Secondary | ICD-10-CM

## 2015-10-07 LAB — HEMOGLOBIN A1C: HEMOGLOBIN A1C: 5.9 % (ref 4.6–6.5)

## 2015-10-07 NOTE — Assessment & Plan Note (Signed)
The patients weight, height, BMI and visual acuity have been recorded in the chart I have made referrals, counseling and provided education to the patient based review of the above and I have provided the pt with a written personalized care plan for preventive services.  Prevnar 13 today. 

## 2015-10-07 NOTE — Addendum Note (Signed)
Addended by: Sydell AxonLAWS, Danne Scardina C on: 10/07/2015 12:44 PM   Modules accepted: Orders

## 2015-10-07 NOTE — Progress Notes (Signed)
Pre visit review using our clinic review tool, if applicable. No additional management support is needed unless otherwise documented below in the visit note. 

## 2015-10-07 NOTE — Assessment & Plan Note (Signed)
Likely because he was not fasting but will an a1c today. The patient indicates understanding of these issues and agrees with the plan.

## 2015-10-07 NOTE — Progress Notes (Signed)
Subjective:   Patient ID: Jerelyn Scott, male    DOB: 1934-07-06, 81 y.o.   MRN: [id number]  OZIE LUPE is a pleasant 81 y.o. year old male who presents to clinic today with Annual Exam  on 10/07/2015  HPI:  I have personally reviewed the Medicare Annual Wellness questionnaire and have noted 1. The patient's medical and social history 2. Their use of alcohol, tobacco or illicit drugs 3. Their current medications and supplements 4. The patient's functional ability including ADL's, fall risks, home safety risks and hearing or visual             impairment. 5. Diet and physical activities 6. Evidence for depression or mood disorders  End of life wishes discussed and updated in Social History.  The roster of all physicians providing medical care to patient - is listed in the CareTeams section of the chart.  Pneumovax 07/22/09 Td 07/22/09  Colonoscopy 01/10/2005  Doing well.  Walks several miles per day and plays golf almost every day.  Rash- on hands and elbows after working with treated lumbar.  Saw Dr. Terri Piedra and placed on oral prednisone, symptoms resolving.  Hyperglycemia- Glucose in CMET was 150.  He was sure if he was fasting.  Denies any increased thirst or urination.   Lab Results  Component Value Date   CHOL 171 10/02/2015   HDL 42.80 10/02/2015   LDLCALC 108* 10/02/2015   TRIG 100.0 10/02/2015   CHOLHDL 4 10/02/2015   Lab Results  Component Value Date   PSA 2.00 10/02/2015   Lab Results  Component Value Date   CREATININE 0.85 10/02/2015   No results found for: TSH Lab Results  Component Value Date   WBC 9.5 10/02/2015   HGB 14.0 10/02/2015   HCT 43.0 10/02/2015   MCV 87.2 10/02/2015   PLT 332.0 10/02/2015    Current Outpatient Prescriptions on File Prior to Visit  Medication Sig Dispense Refill  . dutasteride (AVODART) 0.5 MG capsule Take 0.5 mg by mouth daily.      . Glucosamine HCl (GLUCOSAMINE PO) Take by mouth 2 (two) times daily.     . Multiple Vitamin (MULTIVITAMIN) tablet Take 1 tablet by mouth daily.    Marland Kitchen omeprazole (PRILOSEC) 20 MG capsule TAKE ONE (1) CAPSULE BY MOUTH EACH DAY 30 capsule 10   No current facility-administered medications on file prior to visit.    No Known Allergies  No past medical history on file.  Past Surgical History  Procedure Laterality Date  . Prostate biopsy      No family history on file.  Social History   Social History  . Marital Status: Married    Spouse Name: N/A  . Number of Children: 2  . Years of Education: N/A   Occupational History  . General Contractor    Social History Main Topics  . Smoking status: Never Smoker   . Smokeless tobacco: Current User    Types: Snuff  . Alcohol Use: 0.0 oz/week    0 Standard drinks or equivalent per week     Comment: rarely  . Drug Use: No  . Sexual Activity: Not on file   Other Topics Concern  . Not on file   Social History Narrative   The PMH, PSH, Social History, Family History, Medications, and allergies have been reviewed in [facility], and have been updated if relevant.   Review of Systems  Constitutional: Negative.   HENT: Negative.   Eyes: Negative.   Respiratory: Negative.  Cardiovascular: Negative.   Gastrointestinal: Negative.   Endocrine: Negative.   Genitourinary: Negative.   Musculoskeletal: Negative.   Skin: Negative.   Allergic/Immunologic: Negative.   Neurological: Negative.   Hematological: Negative.   Psychiatric/Behavioral: Negative.   All other systems reviewed and are negative.      Objective:    BP 128/68 mmHg  Pulse 76  Temp(Src) 98.3 F (36.8 C) (Oral)  Ht 5\' 10"  (1.778 m)  Wt 172 lb 8 oz (78.245 kg)  BMI 24.75 kg/m2   Physical Exam  Constitutional: He is oriented to person, place, and time. He appears well-developed and well-nourished. No distress.  Eyes: Conjunctivae are normal.  Neck: Normal range of motion.  Cardiovascular: Normal rate and regular rhythm.     Pulmonary/Chest: Effort normal and breath sounds normal.  Musculoskeletal: Normal range of motion. He exhibits no edema.  Neurological: He is alert and oriented to person, place, and time. No cranial nerve deficit.  Skin: Skin is warm and dry. He is not diaphoretic.  Psychiatric: He has a normal mood and affect. His behavior is normal. Judgment and thought content normal.  Nursing note and vitals reviewed.         Assessment & Plan:   Visit for well man health check No Follow-up on file.

## 2015-10-07 NOTE — Patient Instructions (Signed)
Great to see you. Happy Holidays.  Check with your insurance to see if they will cover the shingles shot.

## 2015-10-09 ENCOUNTER — Encounter: Payer: Self-pay | Admitting: *Deleted

## 2015-10-13 ENCOUNTER — Other Ambulatory Visit: Payer: Self-pay | Admitting: Family Medicine

## 2015-10-27 ENCOUNTER — Ambulatory Visit (INDEPENDENT_AMBULATORY_CARE_PROVIDER_SITE_OTHER): Payer: Medicare Other | Admitting: Internal Medicine

## 2015-10-27 ENCOUNTER — Encounter: Payer: Self-pay | Admitting: Internal Medicine

## 2015-10-27 VITALS — BP 136/84 | HR 66 | Temp 98.4°F | Wt 173.5 lb

## 2015-10-27 DIAGNOSIS — S29012A Strain of muscle and tendon of back wall of thorax, initial encounter: Secondary | ICD-10-CM | POA: Diagnosis not present

## 2015-10-27 MED ORDER — METHOCARBAMOL 500 MG PO TABS: 500.0000 mg | ORAL_TABLET | Freq: Three times a day (TID) | ORAL | Status: DC | PRN

## 2015-10-27 NOTE — Progress Notes (Signed)
Pre visit review using our clinic review tool, if applicable. No additional management support is needed unless otherwise documented below in the visit note. 

## 2015-10-27 NOTE — Progress Notes (Signed)
Subjective:    Patient ID: Jacob Gray, male    DOB: [birth year], 82 y.o.   MRN: [id number]  HPI  Pt presents to the clinic today with c/o pain in his left shoulder. This started 4-5 days ago. He describes the pain as tight and achy. He feels like it is pulling. The pain does not radiate. He denies numbness or tingling. He has not had a rash in the area. He has tried Ibuprofen and Aleve with some relief. He denies any injury to the left shoulder.  Review of Systems      No past medical history on file.  Current Outpatient Prescriptions  Medication Sig Dispense Refill  . clobetasol cream (TEMOVATE) 0.05 % Apply 1 application topically 2 (two) times daily.    Marland Kitchen dutasteride (AVODART) 0.5 MG capsule Take 0.5 mg by mouth daily.      . Glucosamine HCl (GLUCOSAMINE PO) Take by mouth 2 (two) times daily.    . Multiple Vitamin (MULTIVITAMIN) tablet Take 1 tablet by mouth daily.    Marland Kitchen omeprazole (PRILOSEC) 20 MG capsule TAKE ONE CAPSULE BY MOUTH DAILY 30 capsule 1   No current facility-administered medications for this visit.    No Known Allergies  No family history on file.  Social History   Social History  . Marital Status: Married    Spouse Name: N/A  . Number of Children: 2  . Years of Education: N/A   Occupational History  . General Contractor    Social History Main Topics  . Smoking status: Never Smoker   . Smokeless tobacco: Current User    Types: Snuff  . Alcohol Use: 0.0 oz/week    0 Standard drinks or equivalent per week     Comment: rarely  . Drug Use: No  . Sexual Activity: Not on file   Other Topics Concern  . Not on file   Social History Narrative   Has a living will- would not desire prolonged life support if futile.     Constitutional: Denies fever, malaise, fatigue, headache or abrupt weight changes.  Respiratory: Denies difficulty breathing, shortness of breath, cough or sputum production.   Cardiovascular: Denies chest pain, chest tightness,  palpitations or swelling in the hands or feet.  Musculoskeletal: Pt reports pain in left upper back. Denies decrease in range of motion, difficulty with gait, or joint pain and swelling.  Skin: Denies redness, rashes, lesions or ulcercations.   No other specific complaints in a complete review of systems (except as listed in HPI above).  Objective:   Physical Exam   BP 136/84 mmHg  Pulse 66  Temp(Src) 98.4 F (36.9 C) (Oral)  Wt 173 lb 8 oz (78.699 kg)  SpO2 98% Wt Readings from Last 3 Encounters:  10/27/15 173 lb 8 oz (78.699 kg)  10/07/15 172 lb 8 oz (78.245 kg)  09/16/15 180 lb (81.647 kg)    General: Appears his stated age, well developed, well nourished in NAD. Skin: Warm, dry and intact. No rashes noted on upper back. Cardiovascular: Normal rate and rhythm. S1,S2 noted.  No murmur, rubs or gallops noted.  Pulmonary/Chest: Normal effort and positive vesicular breath sounds. No respiratory distress. No wheezes, rales or ronchi noted.  Musculoskeletal: Normal flexion, extension and rotation of the cervical spine. Normal internal and external rotation of the left shoulder. No pain with palpation of the left shoulder. Tenderness noted with palpation of the left trapezius. Strength 5/5 BUE.  BMET    Component Value Date/Time  NA 139 10/02/2015 0828   K 4.6 10/02/2015 0828   CL 103 10/02/2015 0828   CO2 30 10/02/2015 0828   GLUCOSE 150* 10/02/2015 0828   BUN 18 10/02/2015 0828   CREATININE 0.85 10/02/2015 0828   CALCIUM 9.4 10/02/2015 0828    Lipid Panel     Component Value Date/Time   CHOL 171 10/02/2015 0828   TRIG 100.0 10/02/2015 0828   HDL 42.80 10/02/2015 0828   CHOLHDL 4 10/02/2015 0828   VLDL 20.0 10/02/2015 0828   LDLCALC 108* 10/02/2015 0828    CBC    Component Value Date/Time   WBC 9.5 10/02/2015 0828   RBC 4.94 10/02/2015 0828   HGB 14.0 10/02/2015 0828   HCT 43.0 10/02/2015 0828   PLT 332.0 10/02/2015 0828   MCV 87.2 10/02/2015 0828   MCHC  32.5 10/02/2015 0828   RDW 13.8 10/02/2015 0828   LYMPHSABS 3.0 10/02/2015 0828   MONOABS 0.6 10/02/2015 0828   EOSABS 0.0 10/02/2015 0828   BASOSABS 0.0 10/02/2015 0828    Hgb A1C Lab Results  Component Value Date   HGBA1C 5.9 10/07/2015        Assessment & Plan:   Muscle strain of left upper back:  Continue Ibuprofen Advised him not to take Ibuprofen and Aleve together Icy hot or Biofreeze may help soothe the area A heating pad may help the pain eRx for Robaxin 500 mg (use mainly at night)- sedation caution given  RTC as needed or if symptoms persist or worsen

## 2015-10-27 NOTE — Patient Instructions (Signed)

## 2015-11-04 ENCOUNTER — Encounter: Payer: Self-pay | Admitting: Family Medicine

## 2015-11-04 ENCOUNTER — Ambulatory Visit (INDEPENDENT_AMBULATORY_CARE_PROVIDER_SITE_OTHER): Payer: Medicare Other | Admitting: Family Medicine

## 2015-11-04 VITALS — BP 150/80 | HR 68 | Temp 98.1°F | Wt 174.2 lb

## 2015-11-04 DIAGNOSIS — T148XXA Other injury of unspecified body region, initial encounter: Secondary | ICD-10-CM

## 2015-11-04 DIAGNOSIS — R21 Rash and other nonspecific skin eruption: Secondary | ICD-10-CM

## 2015-11-04 DIAGNOSIS — T148 Other injury of unspecified body region: Secondary | ICD-10-CM | POA: Diagnosis not present

## 2015-11-04 MED ORDER — TIZANIDINE HCL 4 MG PO TABS: 2.0000 mg | ORAL_TABLET | Freq: Four times a day (QID) | ORAL | Status: DC | PRN

## 2015-11-04 NOTE — Patient Instructions (Addendum)
I would keep using the cream on your hands for now.  Notify Dr. Dayton Martes if not better.   I think you likely have a muscle strain that intermittently spasms Stop robaxin, change to tizanidine.  I sent the med to the pharmacy.  In the meantime, keep stretching gently, keep walking, and use a tennis ball on the wall to massage the area.  Take care.  Glad to see you.

## 2015-11-04 NOTE — Progress Notes (Signed)
Pre visit review using our clinic review tool, if applicable. No additional management support is needed unless otherwise documented below in the visit note.  "I pulled a muscle in my shoulder."  He tried nsaids, w/o relief.  Robaxin didn't help but he was getting better.  Then Sunday AM he was getting up to go to church.  He leaned over to fix his belt "and it felt like to tore all to pieces."  Pain in L upper back now, when sitting.  Less pain walking.  No R sided pain.  Pain near the shoulder blade on the L side.  No chest pain.  Horse liniment helped some, when his wife rubbed that on.  Hot water in the shower helps some.    ~2 months ago, had rash on hands after working with treated lumber. Saw Dr. Terri Piedra and placed on oral prednisone then put on topical tx.  Still with stinging in the hands at the site of the prev rash when washing his hands.  He showed me a picture and the lesions are clearly improved.  The palmar skin prev peeled a lot, unclear if from the process or the treatment.  He isn't on topical tx at this point. He had skin biopsy prev done.   Meds, vitals, and allergies reviewed.   ROS: See HPI.  Otherwise, noncontributory.  nad ncat Neck supple.  Normal ROM but pain in the L upper back medial to the scapula with neck flexion.   No midline back pain.  rrr ctab No rash on the trunk ttp medial to the L scapula Normal ROM L shoulder, no pain with int/ext rotation Resolving rash on the B palms w/o ulceration. No dorsal lesions.

## 2015-11-05 DIAGNOSIS — T148XXA Other injury of unspecified body region, initial encounter: Secondary | ICD-10-CM | POA: Insufficient documentation

## 2015-11-05 NOTE — Assessment & Plan Note (Signed)
F/u with PCP if not better. Appears to be resolving.

## 2015-11-05 NOTE — Assessment & Plan Note (Signed)
Likely have a muscle strain that intermittently spasms  Stop robaxin, change to tizanidine. D/w pt, routine cautions.   In the meantime, keep stretching gently, keep walking, and use a tennis ball on the wall to massage the area.  F/u prn.

## 2015-11-14 ENCOUNTER — Encounter: Payer: Self-pay | Admitting: Family Medicine

## 2015-11-14 ENCOUNTER — Ambulatory Visit (INDEPENDENT_AMBULATORY_CARE_PROVIDER_SITE_OTHER): Payer: Medicare Other | Admitting: Family Medicine

## 2015-11-14 ENCOUNTER — Ambulatory Visit (INDEPENDENT_AMBULATORY_CARE_PROVIDER_SITE_OTHER)
Admission: RE | Admit: 2015-11-14 | Discharge: 2015-11-14 | Disposition: A | Discharge status: Home / Self Care | Payer: Medicare Other | Source: Ambulatory Visit | Attending: Family Medicine | Admitting: Family Medicine

## 2015-11-14 VITALS — BP 150/66 | HR 64 | Temp 97.9°F | Wt 177.8 lb

## 2015-11-14 DIAGNOSIS — S29012D Strain of muscle and tendon of back wall of thorax, subsequent encounter: Secondary | ICD-10-CM | POA: Diagnosis not present

## 2015-11-14 DIAGNOSIS — M546 Pain in thoracic spine: Secondary | ICD-10-CM

## 2015-11-14 DIAGNOSIS — T148XXA Other injury of unspecified body region, initial encounter: Secondary | ICD-10-CM

## 2015-11-14 NOTE — Progress Notes (Signed)
Pre visit review using our clinic review tool, if applicable. No additional management support is needed unless otherwise documented below in the visit note.  The hand rash is gradually fading and appears to be resolving.   Still with L upper back pain.  Still in the same area.  Pain with sneezing.  Still with pain with neck flexion, ie chin to chest.  Change in muscle relaxers didn't help.  No rash.  He has tried ice/hot showers/advil before bed.  He wakes up in the AM w/o pain but then has pain about 30 min after getting up.   Meds, vitals, and allergies reviewed.   ROS: See HPI.  Otherwise, noncontributory.  nad ncat rrr ctab Neck supple, normal ROM but pain in the L upper back with chin to chest Back not ttp in midline, is ttp L of midline but medial to L scapula w/o bruising.  Normal B shoulder ROM No rash.

## 2015-11-14 NOTE — Patient Instructions (Signed)
Go to the lab on the way out.  We'll contact you with your xray report. We'll go from there.  The chiropractor may be a good idea (vs. Physical therapy). Take care.  Glad to see you.

## 2015-11-16 NOTE — Assessment & Plan Note (Signed)
Likely recurrent, d/w pt.  See imaging.  He wants to check with chiropractor.  This is reasonable, vs PT.  See notes on imaging.  He agrees.  See AVS.

## 2015-12-15 ENCOUNTER — Other Ambulatory Visit: Payer: Self-pay | Admitting: Family Medicine

## 2016-04-19 IMAGING — CR DG THORACIC SPINE 3V
3 series · 3 of 3 positions shown · non-contrast
Comparison: None.

CLINICAL DATA: Left upper back pain medial to the left scapula.
Initial encounter.

EXAM:
THORACIC SPINE - 3 VIEWS

[view not recorded (1 of 3)]
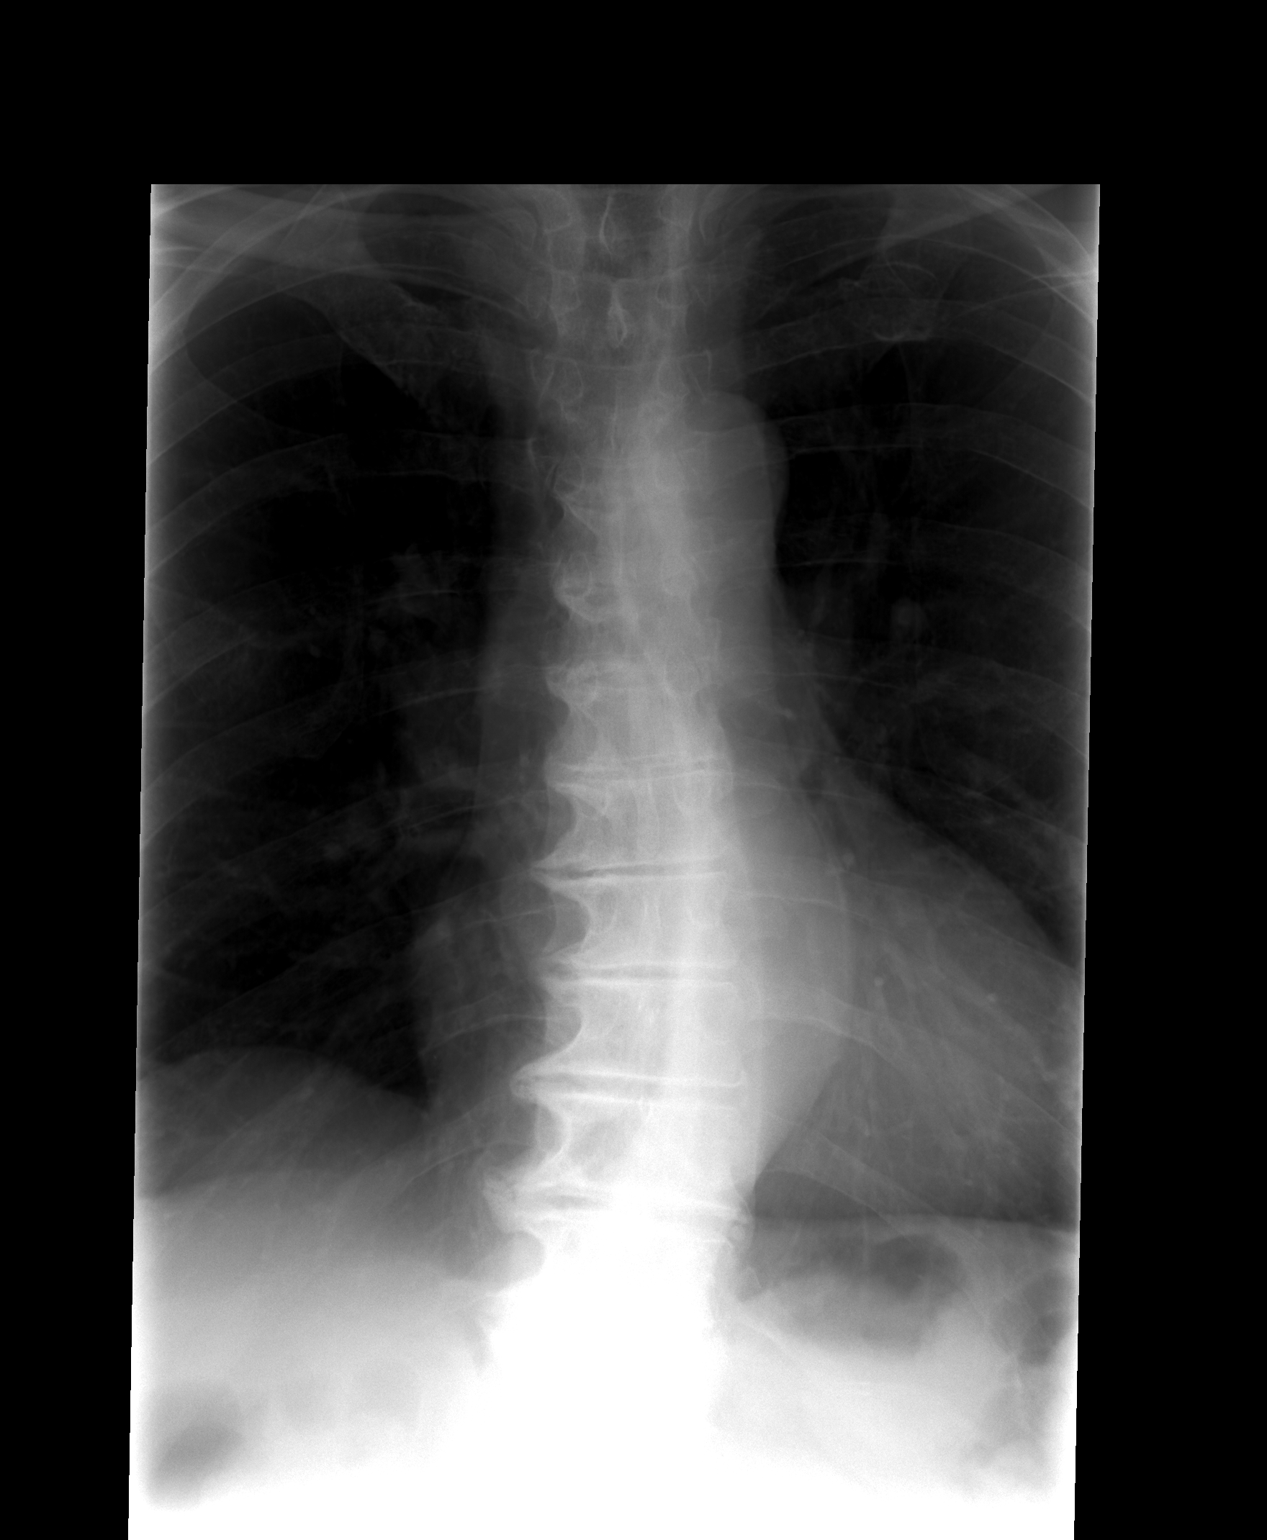

[view not recorded (2 of 3)]
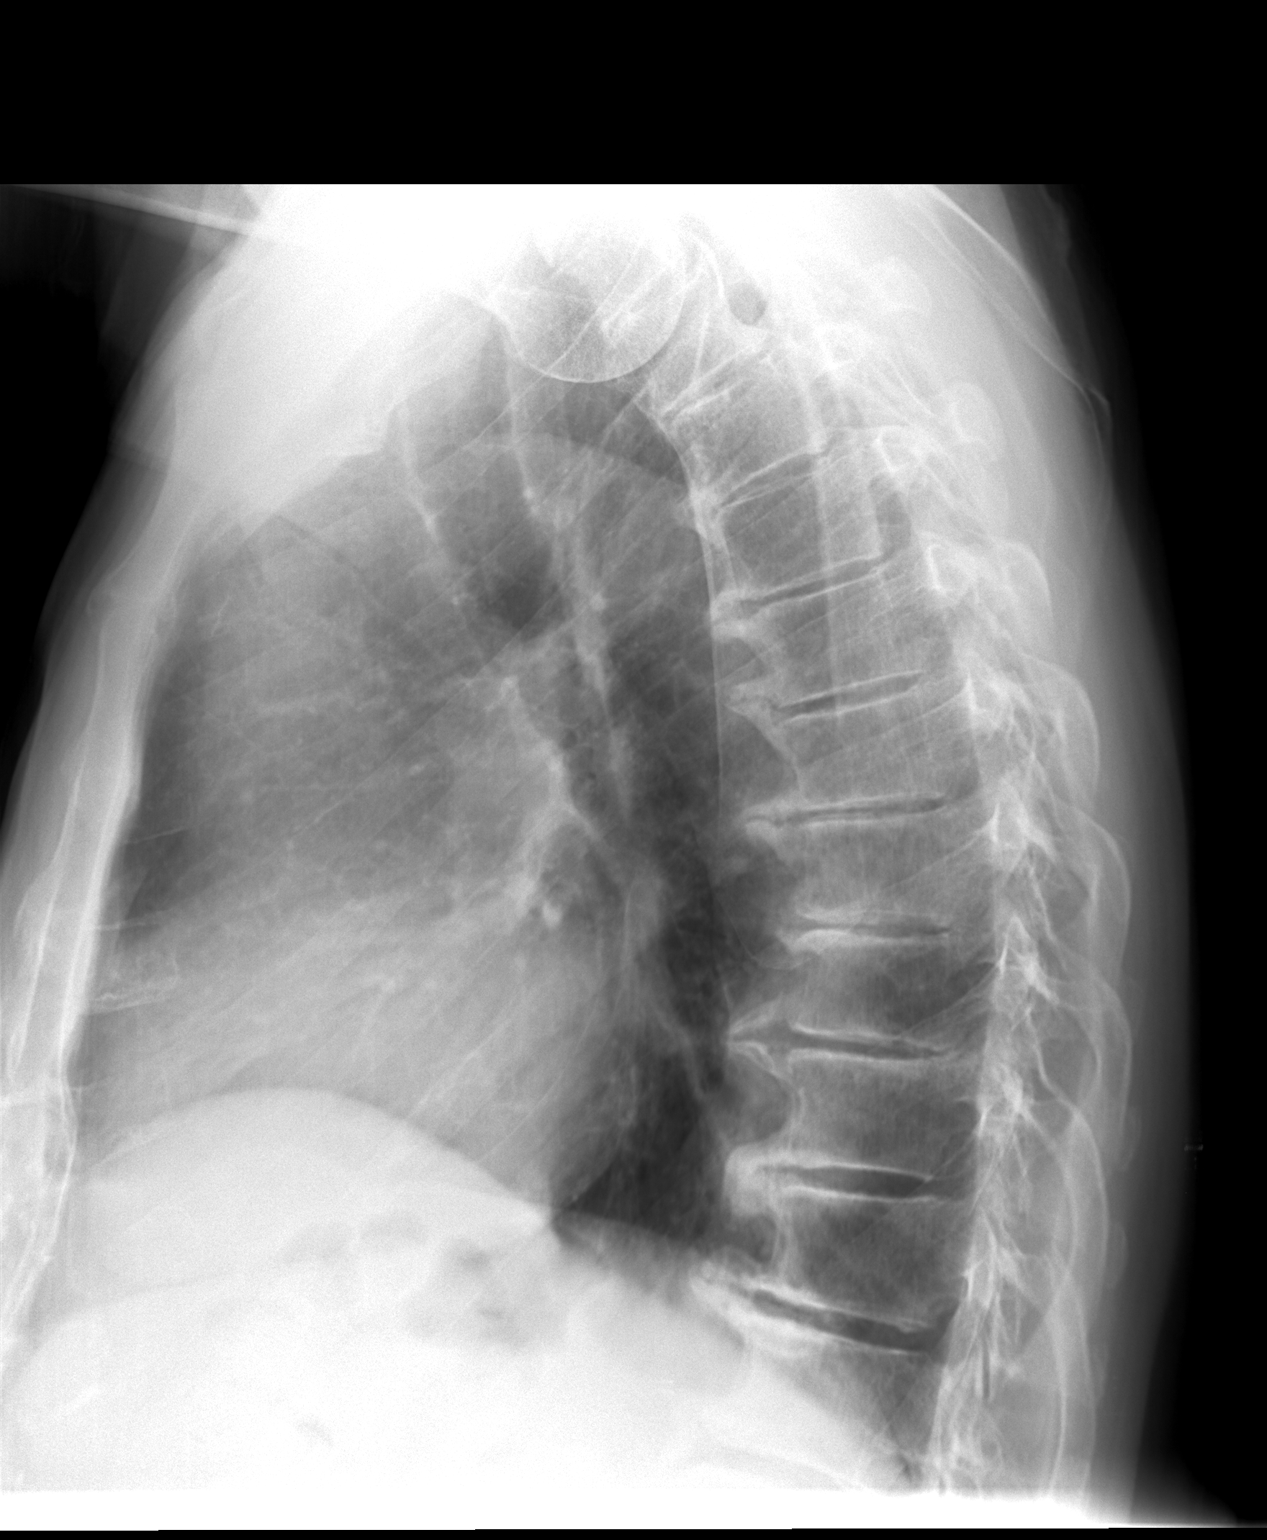

[view not recorded (3 of 3)]
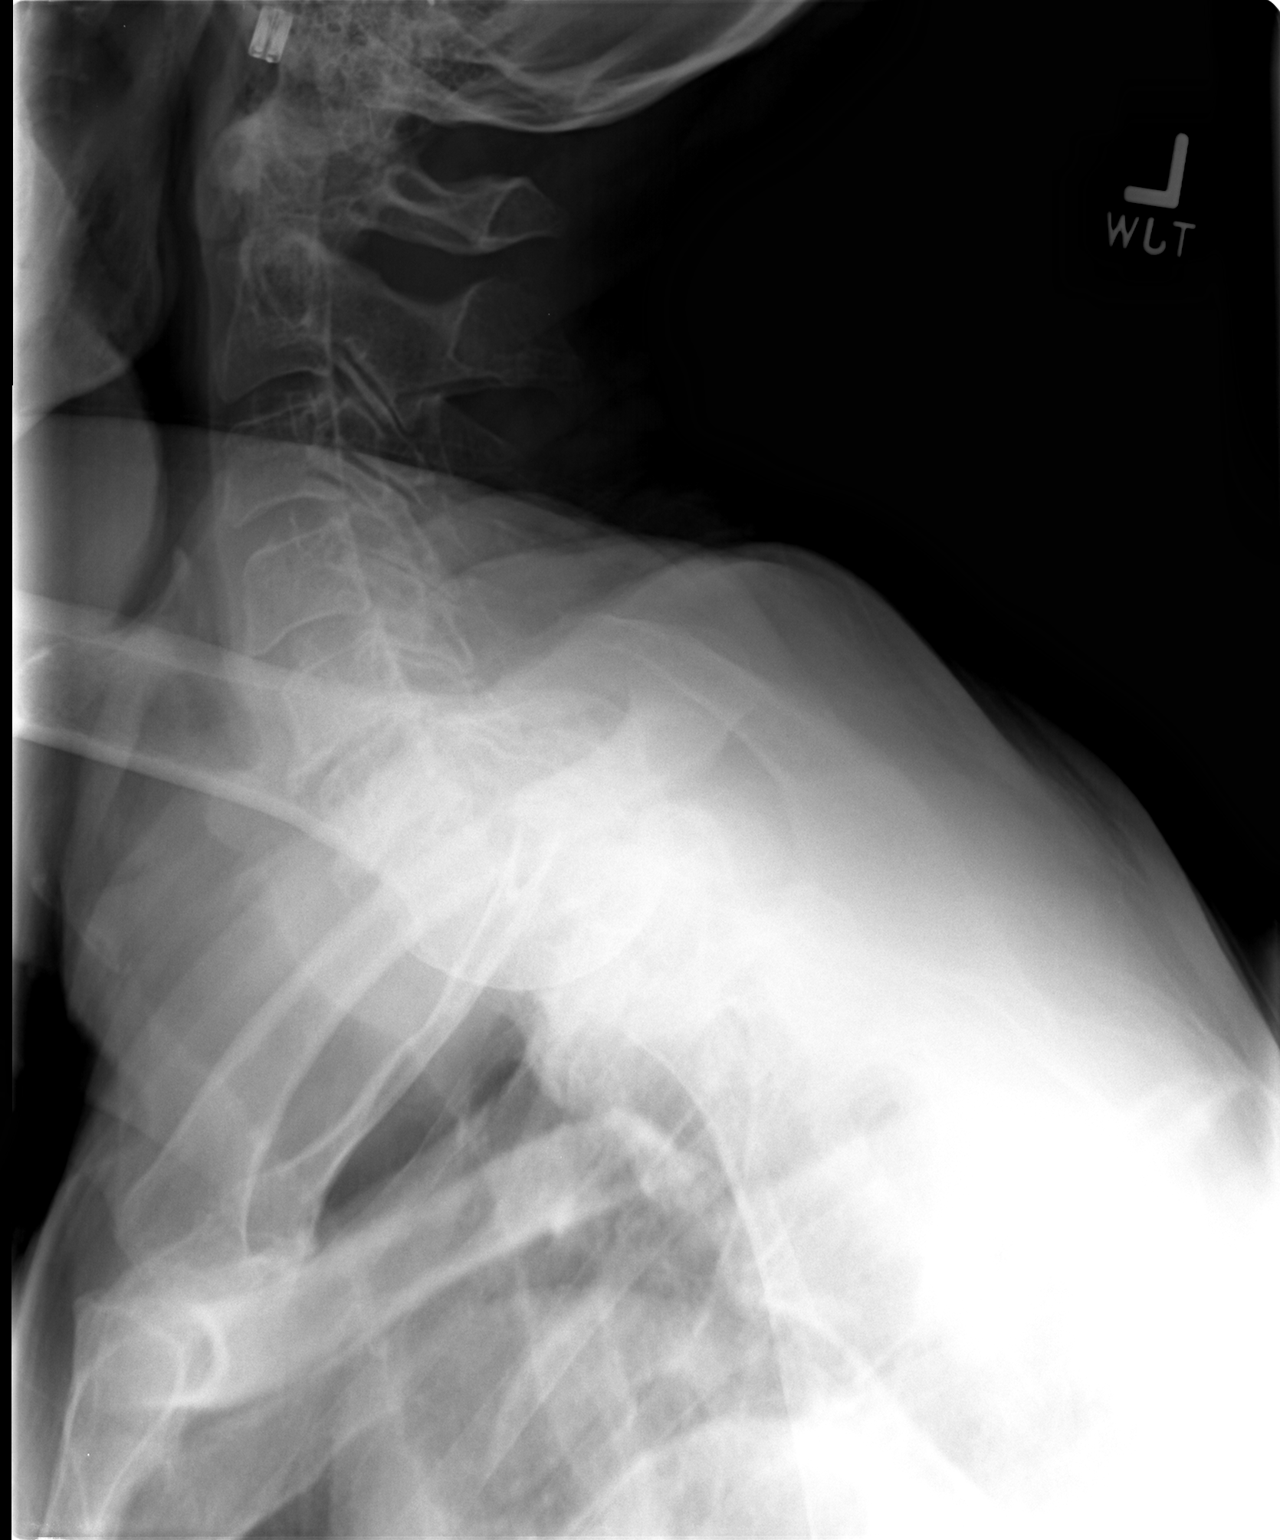

[3 of 3 positions shown; findings below may reference images not displayed]

FINDINGS: Vertebral body height and alignment are maintained. There is convex
left scoliosis with apex at T10. Multilevel loss of disc space
height and anterior endplate spurring are seen.
IMPRESSION: No acute abnormality.

Thoracic scoliosis and spondylosis.

## 2016-07-08 ENCOUNTER — Ambulatory Visit: Payer: Medicare Other | Admitting: Family Medicine

## 2016-07-08 ENCOUNTER — Ambulatory Visit (INDEPENDENT_AMBULATORY_CARE_PROVIDER_SITE_OTHER): Payer: Medicare Other | Admitting: Family Medicine

## 2016-07-08 VITALS — BP 130/72 | HR 85 | Temp 98.6°F | Wt 172.0 lb

## 2016-07-08 DIAGNOSIS — Z23 Encounter for immunization: Secondary | ICD-10-CM

## 2016-07-08 DIAGNOSIS — W5501XA Bitten by cat, initial encounter: Secondary | ICD-10-CM | POA: Diagnosis not present

## 2016-07-08 MED ORDER — AMOXICILLIN-POT CLAVULANATE 875-125 MG PO TABS: 1.0000 | ORAL_TABLET | Freq: Two times a day (BID) | ORAL | 0 refills | Status: DC

## 2016-07-08 NOTE — Patient Instructions (Signed)
Please let us know if you develop drainage, more redness, swelling or pain or if you develop fever If your cat develops any signs of rabies please contact our office immediately   Animal Bite Animal bites can range from mild to serious. An animal bite can result in a scratch on the skin, a deep open cut, a puncture of the skin, a crush injury, or tearing away of the skin or a body part. A small bite from a house pet will usually not cause serious problems. However, some animal bites can become infected or injure a bone or other tissue.  Bites from certain animals can be more dangerous because of the risk of spreading rabies, which is a serious viral infection. This risk is higher with bites from stray animals or wild animals, such as raccoons, foxes, skunks, and bats. Dogs are responsible for most animal bites. Children are bitten more often than adults. SYMPTOMS  Common symptoms of an animal bite include:   Pain.   Bleeding.   Swelling.   Bruising.  DIAGNOSIS  This condition may be diagnosed based on a physical exam and medical history. Your health care provider will examine the wound and ask for details about the animal and how the bite happened. You may also have tests, such as:   Blood tests to check for infection or to determine if surgery is needed.  X-rays to check for damage to bones or joints.  Culture test. This uses a sample of fluid from the wound to check for infection. TREATMENT  Treatment varies depending on the location and type of animal bite and your medical history. Treatment may include:   Wound care. This often includes cleaning the wound, flushing the wound with saline solution, and applying a bandage (dressing). Sometimes, the wound is left open to heal because of the high risk of infection. However, in some cases, the wound may be closed with stitches (sutures), staples, skin glue, or adhesive strips.   Antibiotic medicine.   Tetanus shot.   Rabies  treatment if the animal could have rabies.  In some cases, bites that have become infected may require IV antibiotics and surgical treatment in the hospital.  HOME CARE INSTRUCTIONS Wound Care  Follow instructions from your health care provider about how to take care of your wound. Make sure you:  Wash your hands with soap and water before you change your dressing. If soap and water are not available, use hand sanitizer.  Change your dressing as told by your health care provider.  Leave sutures, skin glue, or adhesive strips in place. These skin closures may need to be in place for 2 weeks or longer. If adhesive strip edges start to loosen and curl up, you may trim the loose edges. Do not remove adhesive strips completely unless your health care provider tells you to do that.  Check your wound every day for signs of infection. Watch for:   Increasing redness, swelling, or pain.   Fluid, blood, or pus.  General Instructions  Take or apply over-the-counter and prescription medicines only as told by your health care provider.   If you were prescribed an antibiotic, take or apply it as told by your health care provider. Do not stop using the antibiotic even if your condition improves.   Keep the injured area raised (elevated) above the level of your heart while you are sitting or lying down, if this is possible.   If directed, apply ice to the injured area.  Put ice in a plastic bag.   Place a towel between your skin and the bag.   Leave the ice on for 20 minutes, 2-3 times per day.   Keep all follow-up visits as told by your health care provider. This is important.  SEEK MEDICAL CARE IF:  You have increasing redness, swelling, or pain at the site of your wound.   You have a general feeling of sickness (malaise).   You feel nauseous or you vomit.   You have pain that does not get better.  SEEK IMMEDIATE MEDICAL CARE IF:  You have a red streak  extending away from your wound.   You have fluid, blood, or pus coming from your wound.   You have a fever or chills.   You have trouble moving your injured area.   You have numbness or tingling extending beyond the wound.   This information is not intended to replace advice given to you by your health care provider. Make sure you discuss any questions you have with your health care provider.   Document Released: 06/22/2011 Document Revised: 06/25/2015 Document Reviewed: 02/19/2015 Elsevier Interactive Patient Education [employer]2016 Elsevier Inc.

## 2016-07-08 NOTE — Progress Notes (Signed)
   Subjective:    Patient ID: Jacob Gray, male    DOB: [birth year], 82 y.o.   MRN: [id number]  HPI This is an 82 yo male who presents today following a cat bite that occurred yesterday. His cat was sitting on his lap (and had been sick for a month from ? Snake bite). The cat must have been dreaming and suddenly woke up and bit him on his right arm, clamping down on the his forearm. Bled very little, has had some pain. Has not taken anything for pain. No fever or chills. Washed area and applied H2O2. Cat is primarily indoors and goes outside for only short periods of time. Cat is not vaccinated.   No past medical history on file. Past Surgical History:  Procedure Laterality Date  . PROSTATE BIOPSY     No family history on file. Social History  Substance Use Topics  . Smoking status: Never Smoker  . Smokeless tobacco: Current User    Types: Snuff  . Alcohol use 0.0 oz/week     Comment: rarely      Review of Systems Per HPI    Objective:   Physical Exam  Constitutional: He is oriented to person, place, and time. He appears well-developed and well-nourished. No distress.  HENT:  Head: Normocephalic and atraumatic.  Eyes: Conjunctivae are normal.  Cardiovascular: Normal rate.   Pulmonary/Chest: Effort normal.  Neurological: He is alert and oriented to person, place, and time.  Skin: Skin is warm and dry. He is not diaphoretic.  Right mid forearm with 4 approximately 1 cm puncture wounds, slight erythema and edema. Not warm.   Vitals reviewed.     BP 130/72   Pulse 85   Temp 98.6 F (37 C)   Wt 172 lb (78 kg)   SpO2 96%   BMI 24.68 kg/m  Wt Readings from Last 3 Encounters:  07/08/16 172 lb (78 kg)  11/14/15 177 lb 12 oz (80.6 kg)  11/04/15 174 lb 4 oz (79 kg)       Assessment & Plan:  1. Cat bite, initial encounter - Provided written and verbal information regarding diagnosis and treatment. - cat has been primarily inside for last month, low suspicion for  rabies, but patient will keep an eye out for any unusual behavior and report to clinic immediately - he was instructed to keep clean and dry, RTC if he develops fever/chills/nausea/vomiting/increased swelling/pain/redness - Tdap vaccine greater than or equal to 7yo IM - amoxicillin-clavulanate (AUGMENTIN) 875-125 MG tablet; Take 1 tablet by mouth 2 (two) times daily.  Dispense: 14 tablet; Refill: 0   Olean Reeeborah Tenelle Andreason, FNP-BC  [facility], [facility]  07/09/2016 8:26 AM

## 2016-07-09 ENCOUNTER — Encounter: Payer: Self-pay | Admitting: Family Medicine

## 2016-09-27 ENCOUNTER — Ambulatory Visit (INDEPENDENT_AMBULATORY_CARE_PROVIDER_SITE_OTHER): Payer: Medicare Other

## 2016-09-27 VITALS — BP 130/80 | HR 72 | Temp 98.6°F | Ht 69.5 in | Wt 173.5 lb

## 2016-09-27 DIAGNOSIS — Z130 Encounter for screening for diseases of the blood and blood-forming organs and certain disorders involving the immune mechanism: Secondary | ICD-10-CM | POA: Diagnosis not present

## 2016-09-27 DIAGNOSIS — R739 Hyperglycemia, unspecified: Secondary | ICD-10-CM | POA: Diagnosis not present

## 2016-09-27 DIAGNOSIS — Z125 Encounter for screening for malignant neoplasm of prostate: Secondary | ICD-10-CM | POA: Diagnosis not present

## 2016-09-27 DIAGNOSIS — Z0000 Encounter for general adult medical examination without abnormal findings: Secondary | ICD-10-CM

## 2016-09-27 DIAGNOSIS — E7800 Pure hypercholesterolemia, unspecified: Secondary | ICD-10-CM

## 2016-09-27 LAB — CBC WITH DIFFERENTIAL/PLATELET
Basophils Absolute: 0 10*3/uL (ref 0.0–0.1)
Basophils Relative: 0.6 % (ref 0.0–3.0)
EOS PCT: 2.3 % (ref 0.0–5.0)
Eosinophils Absolute: 0.2 10*3/uL (ref 0.0–0.7)
HEMATOCRIT: 43.2 % (ref 39.0–52.0)
HEMOGLOBIN: 14.6 g/dL (ref 13.0–17.0)
LYMPHS ABS: 1.7 10*3/uL (ref 0.7–4.0)
Lymphocytes Relative: 24.5 % (ref 12.0–46.0)
MCHC: 33.8 g/dL (ref 30.0–36.0)
MCV: 86.1 fl (ref 78.0–100.0)
MONOS PCT: 6.1 % (ref 3.0–12.0)
Monocytes Absolute: 0.4 10*3/uL (ref 0.1–1.0)
NEUTROS PCT: 66.5 % (ref 43.0–77.0)
Neutro Abs: 4.7 10*3/uL (ref 1.4–7.7)
Platelets: 272 10*3/uL (ref 150.0–400.0)
RBC: 5.02 Mil/uL (ref 4.22–5.81)
RDW: 13.7 % (ref 11.5–15.5)
WBC: 7.1 10*3/uL (ref 4.0–10.5)

## 2016-09-27 LAB — COMPREHENSIVE METABOLIC PANEL
ALK PHOS: 62 U/L (ref 39–117)
ALT: 13 U/L (ref 0–53)
AST: 11 U/L (ref 0–37)
Albumin: 4.2 g/dL (ref 3.5–5.2)
BUN: 19 mg/dL (ref 6–23)
CO2: 27 mEq/L (ref 19–32)
Calcium: 9.3 mg/dL (ref 8.4–10.5)
Chloride: 104 mEq/L (ref 96–112)
Creatinine, Ser: 0.88 mg/dL (ref 0.40–1.50)
GFR: 88.49 mL/min (ref >60.00)
GLUCOSE: 113 mg/dL — AB (ref 70–99)
POTASSIUM: 4.8 meq/L (ref 3.5–5.1)
Sodium: 138 mEq/L (ref 135–145)
TOTAL PROTEIN: 6.3 g/dL (ref 6.0–8.3)
Total Bilirubin: 0.4 mg/dL (ref 0.2–1.2)

## 2016-09-27 LAB — LIPID PANEL
CHOL/HDL RATIO: 5
Cholesterol: 181 mg/dL (ref 0–200)
HDL: 34 mg/dL — AB (ref >39.00)
NONHDL: 146.76
TRIGLYCERIDES: 353 mg/dL — AB (ref 0.0–149.0)
VLDL: 70.6 mg/dL — ABNORMAL HIGH (ref 0.0–40.0)

## 2016-09-27 LAB — LDL CHOLESTEROL, DIRECT: Direct LDL: 111 mg/dL

## 2016-09-27 LAB — PSA, MEDICARE: PSA: 3.09 ng/mL (ref 0.10–4.00)

## 2016-09-27 LAB — HEMOGLOBIN A1C: Hgb A1c MFr Bld: 5.6 % (ref 4.6–6.5)

## 2016-09-27 NOTE — Patient Instructions (Signed)
Mr. Jacob Gray , Thank you for taking time to come for your Medicare Wellness Visit. I appreciate your ongoing commitment to your health goals. Please review the following plan we discussed and let me know if I can assist you in the future.   These are the goals we discussed: Goals    . Increase physical activity          Starting 09/27/2016, I will continue to exercise at least 2 hours daily as weather and schedule permits.        This is a list of the screening recommended for you and due dates:  Health Maintenance  Topic Date Due  . Shingles Vaccine  09/27/2017*  . Tetanus Vaccine  07/08/2026  . Flu Shot  Addressed  . Pneumonia vaccines  Completed  *Topic was postponed. The date shown is not the original due date.   Preventive Care for Adults  A healthy lifestyle and preventive care can promote health and wellness. Preventive health guidelines for adults include the following key practices.  . A routine yearly physical is a good way to check with your health care provider about your health and preventive screening. It is a chance to share any concerns and updates on your health and to receive a thorough exam.  . Visit your dentist for a routine exam and preventive care every 6 months. Brush your teeth twice a day and floss once a day. Good oral hygiene prevents tooth decay and gum disease.  . The frequency of eye exams is based on your age, health, family medical history, use  of contact lenses, and other factors. Follow your health care provider's ecommendations for frequency of eye exams.  . Eat a healthy diet. Foods like vegetables, fruits, whole grains, low-fat dairy products, and lean protein foods contain the nutrients you need without too many calories. Decrease your intake of foods high in solid fats, added sugars, and salt. Eat the right amount of calories for you. Get information about a proper diet from your health care provider, if necessary.  . Regular physical  exercise is one of the most important things you can do for your health. Most adults should get at least 150 minutes of moderate-intensity exercise (any activity that increases your heart rate and causes you to sweat) each week. In addition, most adults need muscle-strengthening exercises on 2 or more days a week.  Silver Sneakers may be a benefit available to you. To determine eligibility, you may visit the website: www.silversneakers.com or contact program at [phone] Mon-Fri between 8AM-8PM.   . Maintain a healthy weight. The body mass index (BMI) is a screening tool to identify possible weight problems. It provides an estimate of body fat based on height and weight. Your health care provider can find your BMI and can help you achieve or maintain a healthy weight.   For adults 20 years and older: ? A BMI below 18.5 is considered underweight. ? A BMI of 18.5 to 24.9 is normal. ? A BMI of 25 to 29.9 is considered overweight. ? A BMI of 30 and above is considered obese.   . Maintain normal blood lipids and cholesterol levels by exercising and minimizing your intake of saturated fat. Eat a balanced diet with plenty of fruit and vegetables. Blood tests for lipids and cholesterol should begin at age 420 and be repeated every 5 years. If your lipid or cholesterol levels are high, you are over 50, or you are at high risk for heart disease, you  may need your cholesterol levels checked more frequently. Ongoing high lipid and cholesterol levels should be treated with medicines if diet and exercise are not working.  . If you smoke, find out from your health care provider how to quit. If you do not use tobacco, please do not start.  . If you choose to drink alcohol, please do not consume more than 2 drinks per day. One drink is considered to be 12 ounces (355 mL) of beer, 5 ounces (148 mL) of wine, or 1.5 ounces (44 mL) of liquor.  . If you are 42-30 years old, ask your health care provider if you  should take aspirin to prevent strokes.  . Use sunscreen. Apply sunscreen liberally and repeatedly throughout the day. You should seek shade when your shadow is shorter than you. Protect yourself by wearing long sleeves, pants, a wide-brimmed hat, and sunglasses year round, whenever you are outdoors.  . Once a month, do a whole body skin exam, using a mirror to look at the skin on your back. Tell your health care provider of new moles, moles that have irregular borders, moles that are larger than a pencil eraser, or moles that have changed in shape or color.

## 2016-09-27 NOTE — Progress Notes (Signed)
Pre visit review using our clinic review tool, if applicable. No additional management support is needed unless otherwise documented below in the visit note. 

## 2016-09-27 NOTE — Progress Notes (Signed)
Subjective:   Jacob Gray is a 82 y.o. male who presents for Medicare Annual/Subsequent preventive examination.  Review of Systems:  N/A Cardiac Risk Factors include: advanced age (>7255men, 97>65 women);male gender;smoking/ tobacco exposure     Objective:    Vitals: BP 130/80 (BP Location: Left Arm, Patient Position: Sitting, Cuff Size: Normal)   Pulse 72   Temp 98.6 F (37 C) (Oral)   Ht 5' 9.5" (1.765 m) Comment: no shoes  Wt 173 lb 8 oz (78.7 kg)   SpO2 97%   BMI 25.25 kg/m   Body mass index is 25.25 kg/m.  Tobacco History  Smoking Status  . Never Smoker  Smokeless Tobacco  . Current User  . Types: Snuff     Ready to quit: No Counseling given: No   History reviewed. No pertinent past medical history. Past Surgical History:  Procedure Laterality Date  . PROSTATE BIOPSY     History reviewed. No pertinent family history. History  Sexual Activity  . Sexual activity: Yes    Outpatient Encounter Prescriptions as of 09/27/2016  Medication Sig  . Glucosamine HCl (GLUCOSAMINE PO) Take by mouth 2 (two) times daily.  . Multiple Vitamin (MULTIVITAMIN) tablet Take 1 tablet by mouth daily.  Marland Kitchen. omeprazole (PRILOSEC) 20 MG capsule TAKE ONE (1) CAPSULE BY MOUTH EACH DAY  . [DISCONTINUED] amoxicillin-clavulanate (AUGMENTIN) 875-125 MG tablet Take 1 tablet by mouth 2 (two) times daily.  . [DISCONTINUED] clobetasol cream (TEMOVATE) 0.05 % Apply 1 application topically 2 (two) times daily.  . [DISCONTINUED] dutasteride (AVODART) 0.5 MG capsule Take 0.5 mg by mouth daily.     No facility-administered encounter medications on file as of 09/27/2016.     Activities of Daily Living In your present state of health, do you have any difficulty performing the following activities: 09/27/2016  Hearing? Y  Vision? N  Difficulty concentrating or making decisions? N  Walking or climbing stairs? N  Dressing or bathing? N  Doing errands, shopping? N  Preparing Food and eating ?  N  Using the Toilet? N  In the past six months, have you accidently leaked urine? N  Do you have problems with loss of bowel control? N  Managing your Medications? N  Managing your Finances? N  Housekeeping or managing your Housekeeping? N  Some recent data might be hidden    Patient Care Team: Dianne Dunalia M Aron, MD as PCP - General Jerilee FieldMatthew Eskridge, MD as Consulting Physician (Urology) Cherlyn RobertsFrederick Lupton, MD as Consulting Physician (Dermatology)   Assessment:    Hearing Screening Comments: Bilateral hearing aids Vision Screening Comments: [facility]  Exercise Activities and Dietary recommendations Current Exercise Habits: Home exercise routine, Type of exercise: Other - see comments;strength training/weights;stretching (golfing, gardening), Time (Minutes): 30, Frequency (Times/Week): 5 (golf can be 9 holes or 18 holes depending upon day of week), Weekly Exercise (Minutes/Week): 150, Intensity: Moderate, Exercise limited by: None identified  Goals    . Increase physical activity          Starting 09/27/2016, I will continue to exercise at least 2 hours daily as weather and schedule permits.       Fall Risk Fall Risk  09/27/2016 10/07/2015  Falls in the past year? No No   Depression Screen PHQ 2/9 Scores 09/27/2016 10/07/2015  PHQ - 2 Score 0 0    Cognitive Function MMSE - Mini Mental State Exam 09/27/2016 09/27/2016  Orientation to time - 5  Orientation to Place -  5  Registration - 3  Attention/ Calculation - 0  Recall (No Data) 2  Recall-comments pt was unable to recall 1 of 3 words -  Language- name 2 objects - 0  Language- repeat - 1  Language- follow 3 step command - 3  Language- read & follow direction - 0  Write a sentence - 0  Copy design - 0  Total score - 19        Immunization History  Administered Date(s) Administered  . Influenza Whole 08/18/2007, 07/23/2008, 09/08/2010  . Influenza, Seasonal, Injecte, Preservative  Fre 07/13/2016  . Influenza-Unspecified 09/02/2014, 07/03/2015  . Pneumococcal Conjugate-13 10/07/2015  . Pneumococcal Polysaccharide-23 07/22/2009  . Td 07/22/2009  . Tdap 07/08/2016   Screening Tests Health Maintenance  Topic Date Due  . ZOSTAVAX  09/27/2017 (Originally 05/29/1996)  . TETANUS/TDAP  07/08/2026  . INFLUENZA VACCINE  Addressed  . PNA vac Low Risk Adult  Completed      Plan:     I have personally reviewed and addressed the Medicare Annual Wellness questionnaire and have noted the following in the patient's chart:  A. Medical and social history B. Use of alcohol, tobacco or illicit drugs  C. Current medications and supplements D. Functional ability and status E.  Nutritional status F.  Physical activity G. Advance directives H. List of other physicians I.  Hospitalizations, surgeries, and ER visits in previous 12 months J.  Vitals K. Screenings to include hearing, vision, cognitive, depression L. Referrals and appointments - none  In addition, I have reviewed and discussed with patient certain preventive protocols, quality metrics, and best practice recommendations. A written personalized care plan for preventive services as well as general preventive health recommendations were provided to patient.  See attached scanned questionnaire for additional information.   Signed,   Randa EvensLesia Pinson, MHA, BS, LPN Health Coach

## 2016-09-27 NOTE — Progress Notes (Signed)
PCP notes:   Health maintenance:  Shingles vaccine - declined/cost of vaccine  Abnormal screenings:   Mini-Cog score: 19/20   Patient concerns:   None  Nurse concerns:  None  Next PCP appt:   10/04/16 @ 1400

## 2016-09-28 NOTE — Progress Notes (Signed)
I reviewed health advisor's note, was available for consultation, and agree with documentation and plan.  

## 2016-10-04 ENCOUNTER — Ambulatory Visit (INDEPENDENT_AMBULATORY_CARE_PROVIDER_SITE_OTHER): Payer: Medicare Other | Admitting: Family Medicine

## 2016-10-04 ENCOUNTER — Encounter: Payer: Self-pay | Admitting: Family Medicine

## 2016-10-04 VITALS — BP 134/72 | HR 76 | Temp 98.3°F | Ht 69.5 in | Wt 174.8 lb

## 2016-10-04 DIAGNOSIS — K219 Gastro-esophageal reflux disease without esophagitis: Secondary | ICD-10-CM | POA: Diagnosis not present

## 2016-10-04 DIAGNOSIS — E782 Mixed hyperlipidemia: Secondary | ICD-10-CM | POA: Insufficient documentation

## 2016-10-04 DIAGNOSIS — R972 Elevated prostate specific antigen [PSA]: Secondary | ICD-10-CM | POA: Diagnosis not present

## 2016-10-04 DIAGNOSIS — E781 Pure hyperglyceridemia: Secondary | ICD-10-CM

## 2016-10-04 DIAGNOSIS — Z0000 Encounter for general adult medical examination without abnormal findings: Secondary | ICD-10-CM

## 2016-10-04 NOTE — Assessment & Plan Note (Signed)
Reviewed preventive care protocols, scheduled due services, and updated immunizations Discussed nutrition, exercise, diet, and healthy lifestyle.  

## 2016-10-04 NOTE — Assessment & Plan Note (Signed)
New- likely due to diet, was not fasting. Recheck fasting lipid panel.

## 2016-10-04 NOTE — Assessment & Plan Note (Signed)
Well controlled with as needed PPI. No changes made to rxs.

## 2016-10-04 NOTE — Patient Instructions (Signed)
Great to see you. Happy Holidays!  At your convenience, come in for fasting labs.

## 2016-10-04 NOTE — Progress Notes (Signed)
Subjective:   Patient ID: Jacob Gray, male    DOB: Jun 08, 1934, 82 y.o.   MRN: [id number]  Jacob Gray is a pleasant 82 y.o. year old male who presents to clinic today with Annual Exam  and follow up of chronic medical conditions on 10/04/2016  HPI:  Medicare wellness visit with Lu Duffelarlesia Pinson, RN on 09/27/16 Note reviewed.  Tdap 07/08/16 Influenza 07/13/16 Pneumovax 07/22/09 Prevnar 13 10/07/15 Dermatologist- sees Dr. Terri PiedraLupton every 6 months.  GERD- symptoms controlled with Prilosec.  Hypertriglyceridemia- admits to eating some carbs and drinking ETOH the day before, was not fasting. Lab Results  Component Value Date   CHOL 181 09/27/2016   HDL 34.00 (L) 09/27/2016   LDLCALC 108 (H) 10/02/2015   LDLDIRECT 111.0 09/27/2016   TRIG 353.0 (H) 09/27/2016   CHOLHDL 5 09/27/2016   Lab Results  Component Value Date   ALT 13 09/27/2016   AST 11 09/27/2016   ALKPHOS 62 09/27/2016   BILITOT 0.4 09/27/2016   Lab Results  Component Value Date   NA 138 09/27/2016   K 4.8 09/27/2016   CL 104 09/27/2016   CO2 27 09/27/2016   Lab Results  Component Value Date   PSA 3.09 09/27/2016   PSA 2.00 10/02/2015     Current Outpatient Prescriptions on File Prior to Visit  Medication Sig Dispense Refill  . Glucosamine HCl (GLUCOSAMINE PO) Take by mouth 2 (two) times daily.    . Multiple Vitamin (MULTIVITAMIN) tablet Take 1 tablet by mouth daily.    Marland Kitchen. omeprazole (PRILOSEC) 20 MG capsule TAKE ONE (1) CAPSULE BY MOUTH EACH DAY 30 capsule 9   No current facility-administered medications on file prior to visit.     No Known Allergies  No past medical history on file.  Past Surgical History:  Procedure Laterality Date  . PROSTATE BIOPSY      No family history on file.  Social History   Social History  . Marital status: Married    Spouse name: N/A  . Number of children: 2  . Years of education: N/A   Occupational History  . General Contractor    Social  History Main Topics  . Smoking status: Never Smoker  . Smokeless tobacco: Current User    Types: Snuff  . Alcohol use 0.0 oz/week     Comment: rarely  . Drug use: No  . Sexual activity: Yes   Other Topics Concern  . Not on file   Social History Narrative   Has a living will- would not desire prolonged life support if futile.   The PMH, PSH, Social History, Family History, Medications, and allergies have been [facility], and have been updated if relevant.   Review of Systems  Constitutional: Negative.   HENT: Negative.   Eyes: Negative.   Respiratory: Negative.   Cardiovascular: Negative.   Gastrointestinal: Negative.   Endocrine: Negative.   Genitourinary: Negative.   Musculoskeletal: Negative.   Skin: Negative.   Allergic/Immunologic: Negative.   Neurological: Negative.   Hematological: Negative.   Psychiatric/Behavioral: Negative.   All other systems reviewed and are negative.      Objective:    BP 134/72   Pulse 76   Temp 98.3 F (36.8 C) (Oral)   Ht 5' 9.5" (1.765 m)   Wt 174 lb 12 oz (79.3 kg)   SpO2 94%   BMI 25.44 kg/m    Physical Exam  General:  pleasant male in no acute distress Eyes:  PERRL Ears:  External ear exam shows no significant lesions or deformities.  TMs normal bilaterally Hearing is grossly normal bilaterally. Nose:  External nasal examination shows no deformity or inflammation. Nasal mucosa are pink and moist without lesions or exudates. Mouth:  Oral mucosa and oropharynx without lesions or exudates.  Teeth in good repair. Neck:  no carotid bruit or thyromegaly no cervical or supraclavicular lymphadenopathy  Lungs:  Normal respiratory effort, chest expands symmetrically. Lungs are clear to auscultation, no crackles or wheezes. Heart:  Normal rate and regular rhythm. S1 and S2 normal without gallop, murmur, click, rub or other extra sounds. Abdomen:  Bowel sounds positive,abdomen soft and non-tender without masses, organomegaly or  hernias noted. Pulses:  R and L posterior tibial pulses are full and equal bilaterally  Extremities:  no edema  Psych:  Good eye contact, not anxious or depressed appearing      Assessment & Plan:   Visit for well man health check  PSA, INCREASED  Gastroesophageal reflux disease, esophagitis presence not specified  Hypertriglyceridemia - Plan: Lipid panel No Follow-up on file.

## 2016-10-05 ENCOUNTER — Encounter: Payer: Self-pay | Admitting: Family Medicine

## 2016-10-20 ENCOUNTER — Other Ambulatory Visit: Payer: Self-pay | Admitting: Family Medicine

## 2016-10-27 ENCOUNTER — Other Ambulatory Visit (INDEPENDENT_AMBULATORY_CARE_PROVIDER_SITE_OTHER): Payer: Medicare Other

## 2016-10-27 DIAGNOSIS — E781 Pure hyperglyceridemia: Secondary | ICD-10-CM | POA: Diagnosis not present

## 2016-10-28 LAB — LIPID PANEL
CHOL/HDL RATIO: 5
Cholesterol: 190 mg/dL (ref 0–200)
HDL: 36 mg/dL — ABNORMAL LOW (ref >39.00)
LDL CALC: 127 mg/dL — AB (ref 0–99)
NONHDL: 153.71
Triglycerides: 136 mg/dL (ref 0.0–149.0)
VLDL: 27.2 mg/dL (ref 0.0–40.0)

## 2017-07-27 ENCOUNTER — Other Ambulatory Visit: Payer: Self-pay | Admitting: Family Medicine

## 2017-10-25 ENCOUNTER — Encounter: Payer: Self-pay | Admitting: Family Medicine

## 2017-10-31 ENCOUNTER — Other Ambulatory Visit: Payer: Self-pay | Admitting: Family Medicine

## 2017-11-07 ENCOUNTER — Ambulatory Visit (INDEPENDENT_AMBULATORY_CARE_PROVIDER_SITE_OTHER): Payer: Medicare Other | Admitting: Family Medicine

## 2017-11-07 ENCOUNTER — Encounter: Payer: Self-pay | Admitting: Family Medicine

## 2017-11-07 VITALS — BP 138/82 | HR 72 | Temp 97.9°F | Ht 70.0 in | Wt 181.6 lb

## 2017-11-07 DIAGNOSIS — E781 Pure hyperglyceridemia: Secondary | ICD-10-CM

## 2017-11-07 DIAGNOSIS — K219 Gastro-esophageal reflux disease without esophagitis: Secondary | ICD-10-CM

## 2017-11-07 DIAGNOSIS — R972 Elevated prostate specific antigen [PSA]: Secondary | ICD-10-CM

## 2017-11-07 DIAGNOSIS — Z0000 Encounter for general adult medical examination without abnormal findings: Secondary | ICD-10-CM

## 2017-11-07 LAB — COMPREHENSIVE METABOLIC PANEL
ALT: 16 U/L (ref 0–53)
AST: 15 U/L (ref 0–37)
Albumin: 4.2 g/dL (ref 3.5–5.2)
Alkaline Phosphatase: 59 U/L (ref 39–117)
BUN: 20 mg/dL (ref 6–23)
CHLORIDE: 104 meq/L (ref 96–112)
CO2: 31 meq/L (ref 19–32)
Calcium: 9.4 mg/dL (ref 8.4–10.5)
Creatinine, Ser: 0.78 mg/dL (ref 0.40–1.50)
GFR: 101.42 mL/min (ref >60.00)
GLUCOSE: 135 mg/dL — AB (ref 70–99)
POTASSIUM: 5.2 meq/L — AB (ref 3.5–5.1)
SODIUM: 143 meq/L (ref 135–145)
Total Bilirubin: 0.8 mg/dL (ref 0.2–1.2)
Total Protein: 6.3 g/dL (ref 6.0–8.3)

## 2017-11-07 LAB — LIPID PANEL
Cholesterol: 163 mg/dL (ref 0–200)
HDL: 37.3 mg/dL — ABNORMAL LOW (ref >39.00)
LDL CALC: 103 mg/dL — AB (ref 0–99)
NONHDL: 125.33
Total CHOL/HDL Ratio: 4
Triglycerides: 110 mg/dL (ref 0.0–149.0)
VLDL: 22 mg/dL (ref 0.0–40.0)

## 2017-11-07 LAB — PSA: PSA: 6.19 ng/mL — AB (ref 0.10–4.00)

## 2017-11-07 MED ORDER — OMEPRAZOLE 20 MG PO CPDR: 20.0000 mg | DELAYED_RELEASE_CAPSULE | Freq: Every day | ORAL | 3 refills | Status: DC

## 2017-11-07 NOTE — Assessment & Plan Note (Signed)
Reviewed preventive care protocols, scheduled due services, and updated immunizations Discussed nutrition, exercise, diet, and healthy lifestyle.  

## 2017-11-07 NOTE — Assessment & Plan Note (Signed)
The patients weight, height, BMI and visual acuity have been recorded in the chart.  Cognitive function assessed.   I have made referrals, counseling and provided education to the patient based review of the above and I have provided the pt with a written personalized care plan for preventive services.  

## 2017-11-07 NOTE — Assessment & Plan Note (Signed)
Well controlled on current dose of prilosec. eRx refills sent.

## 2017-11-07 NOTE — Assessment & Plan Note (Signed)
Labs today

## 2017-11-07 NOTE — Patient Instructions (Signed)
Great to see you. I will call you with your lab results from today and you can view them online.   

## 2017-11-07 NOTE — Progress Notes (Signed)
Subjective:   Patient ID: Jacob Gray, male    DOB: [birth year], 84 y.o.   MRN: [id number]  Jacob Gray is a pleasant 84 y.o. year old male who presents to clinic today with Annual Exam (Patient is here today for an annual wellness exam.  He states that he is scheduled next week for an eye exam at [facility].  He has hearing aids and had screening 2 months ago at [facility].  He is not currently fasting.)  and follow up of chronic medical conditions on 11/07/2017  HPI:  I have personally reviewed the Medicare Annual Wellness questionnaire and have noted 1. The patient's medical and social history 2. Their use of alcohol, tobacco or illicit drugs 3. Their current medications and supplements 4. The patient's functional ability including ADL's, fall risks, home safety risks and hearing or visual             impairment. 5. Diet and physical activities 6. Evidence for depression or mood disorders  End of life wishes discussed and updated in Social History.  The roster of all physicians providing medical care to patient - is listed in the CareTeams section of the chart.  Health Maintenance  Topic Date Due  . TETANUS/TDAP  07/08/2026  . INFLUENZA VACCINE  Completed  . PNA vac Low Risk Adult  Completed      GERD- symptoms controlled with Prilosec.   Lab Results  Component Value Date   CHOL 190 10/27/2016   HDL 36.00 (L) 10/27/2016   LDLCALC 127 (H) 10/27/2016   LDLDIRECT 111.0 09/27/2016   TRIG 136.0 10/27/2016   CHOLHDL 5 10/27/2016   Lab Results  Component Value Date   ALT 13 09/27/2016   AST 11 09/27/2016   ALKPHOS 62 09/27/2016   BILITOT 0.4 09/27/2016   Lab Results  Component Value Date   NA 138 09/27/2016   K 4.8 09/27/2016   CL 104 09/27/2016   CO2 27 09/27/2016   Lab Results  Component Value Date   PSA 3.09 09/27/2016   PSA 2.00 10/02/2015     Current Outpatient Medications on File Prior to Visit  Medication Sig Dispense  Refill  . Glucosamine HCl (GLUCOSAMINE PO) Take by mouth 2 (two) times daily.    . Multiple Vitamin (MULTIVITAMIN) tablet Take 1 tablet by mouth daily.     No current facility-administered medications on file prior to visit.     No Known Allergies  No past medical history on file.  Past Surgical History:  Procedure Laterality Date  . PROSTATE BIOPSY      No family history on file.  Social History   Socioeconomic History  . Marital status: Married    Spouse name: Not on file  . Number of children: 2  . Years of education: Not on file  . Highest education level: Not on file  Social Needs  . Financial resource strain: Not on file  . Food insecurity - worry: Not on file  . Food insecurity - inability: Not on file  . Transportation needs - medical: Not on file  . Transportation needs - non-medical: Not on file  Occupational History  . Occupation: Product/process development scientist  Tobacco Use  . Smoking status: Never Smoker  . Smokeless tobacco: Current User    Types: Snuff  Substance and Sexual Activity  . Alcohol use: Yes    Alcohol/week: 0.0 oz    Comment: rarely  . Drug use: No  . Sexual activity: Yes  Other Topics Concern  . Not on file  Social History Narrative   Has a living will- would not desire prolonged life support if futile.   The PMH, PSH, Social History, Family History, Medications, and allergies have been reviewed in [facility], and have been updated if relevant.   Review of Systems  Constitutional: Negative.   HENT: Negative.   Eyes: Negative.   Respiratory: Negative.   Cardiovascular: Negative.   Gastrointestinal: Negative.   Endocrine: Negative.   Genitourinary: Negative.   Musculoskeletal: Negative.   Skin: Negative.   Allergic/Immunologic: Negative.   Neurological: Negative.   Hematological: Negative.   Psychiatric/Behavioral: Negative.   All other systems reviewed and are negative.      Objective:    BP 138/82 (BP Location: Left Arm, Patient  Position: Sitting, Cuff Size: Normal)   Pulse 72   Temp 97.9 F (36.6 C) (Oral)   Ht 5\' 10"  (1.778 m)   Wt 181 lb 9.6 oz (82.4 kg)   SpO2 96%   BMI 26.06 kg/m    Physical Exam  General:  pleasant male in no acute distress Eyes:  PERRL Ears:  External ear exam shows no significant lesions or deformities.  TMs normal bilaterally Hearing is grossly normal bilaterally. Nose:  External nasal examination shows no deformity or inflammation. Nasal mucosa are pink and moist without lesions or exudates. Mouth:  Oral mucosa and oropharynx without lesions or exudates.  Teeth in good repair. Neck:  no carotid bruit or thyromegaly no cervical or supraclavicular lymphadenopathy  Lungs:  Normal respiratory effort, chest expands symmetrically. Lungs are clear to auscultation, no crackles or wheezes. Heart:  Normal rate and regular rhythm. S1 and S2 normal without gallop, murmur, click, rub or other extra sounds. Abdomen:  Bowel sounds positive,abdomen soft and non-tender without masses, organomegaly or hernias noted. Pulses:  R and L posterior tibial pulses are full and equal bilaterally  Extremities:  no edema  Psych:  Good eye contact, not anxious or depressed appearing     Assessment & Plan:   Visit for well man health check  Medicare annual wellness visit, subsequent No Follow-up on file.

## 2017-11-10 ENCOUNTER — Telehealth: Payer: Self-pay

## 2017-11-10 NOTE — Telephone Encounter (Signed)
LMOVM for pt to RTC/need to know if has appt with Urology scheduled yet and need to know who and where it is so lab can be faxed to them/thx dmf

## 2017-11-10 NOTE — Telephone Encounter (Signed)
-----   Message from Dianne Dunalia M Aron, MD sent at 11/09/2017  5:25 PM EST ----- Okay great.  Then I am not concerned.  Thank you! ----- Message ----- From: Lerry LinerFrederick, Ryliee Figge M, CMA Sent: 11/09/2017   4:39 PM To: Dianne Dunalia M Aron, MD  TA-Patient was not fasting for these labs/plz advise/thx dmf

## 2018-07-27 ENCOUNTER — Ambulatory Visit (INDEPENDENT_AMBULATORY_CARE_PROVIDER_SITE_OTHER): Payer: Medicare Other

## 2018-07-27 DIAGNOSIS — Z23 Encounter for immunization: Secondary | ICD-10-CM | POA: Diagnosis not present

## 2018-11-01 ENCOUNTER — Other Ambulatory Visit: Payer: Self-pay | Admitting: Family Medicine

## 2019-02-11 ENCOUNTER — Other Ambulatory Visit: Payer: Self-pay | Admitting: Family Medicine

## 2019-02-11 NOTE — Telephone Encounter (Signed)
PT needs to schedule OV.

## 2019-03-07 ENCOUNTER — Other Ambulatory Visit: Payer: Self-pay | Admitting: Family Medicine

## 2019-04-17 ENCOUNTER — Telehealth: Payer: Self-pay

## 2019-04-17 DIAGNOSIS — H903 Sensorineural hearing loss, bilateral: Secondary | ICD-10-CM | POA: Insufficient documentation

## 2019-04-17 NOTE — Telephone Encounter (Signed)
Questions for Screening COVID-19  Symptom onset: None  Travel or Contacts: None  During this illness, did/does the patient experience any of the following symptoms? Fever >100.64F []   Yes [x]   No []   Unknown Subjective fever (felt feverish) []   Yes [x]   No []   Unknown Chills []   Yes [x]   No []   Unknown Muscle aches (myalgia) []   Yes [x]   No []   Unknown Runny nose (rhinorrhea) []   Yes [x]   No []   Unknown Sore throat []   Yes [x]   No []   Unknown Cough (new onset or worsening of chronic cough) []   Yes [x]   No []   Unknown Shortness of breath (dyspnea) []   Yes [x]   No []   Unknown Nausea or vomiting []   Yes [x]   No []   Unknown Headache []   Yes [x]   No []   Unknown Abdominal pain  []   Yes [x]   No []   Unknown Diarrhea (?3 loose/looser than normal stools/24hr period) []   Yes [x]   No []   Unknown Other, specify:  Patient risk factors: Smoker? []   Current []   Former []   Never If male, currently pregnant? []   Yes []   No  Patient Active Problem List   Diagnosis Date Noted  . Medicare annual wellness visit, subsequent 11/07/2017  . Hypertriglyceridemia 10/04/2016  . Hyperglycemia 10/07/2015  . Visit for well man health check 09/30/2015  . Epiphora 12/27/2012  . Nuclear cataract 11/17/2011  . Bilateral dry eyes 11/17/2011  . GERD 07/22/2009  . PSA, INCREASED 08/14/2008    Plan:  []   High risk for COVID-19 with red flags go to ED (with CP, SOB, weak/lightheaded, or fever > 101.5). Call ahead.  []   High risk for COVID-19 but stable. Inform provider and coordinate time for [facility] visit.   []   No red flags but URI signs or symptoms okay for [facility] visit.

## 2019-04-17 NOTE — Progress Notes (Signed)
Subjective:   Patient ID: Jacob Gray, male    DOB: 02/11/1934, 85 y.o.   MRN: [id number]  RANDELL TEARE is a pleasant 85 y.o. year old male who presents to clinic today with Follow-up (Pt screened at vehicle. He is here today  for problem F/U.  He is not currently fasting. Immunizations are UTD. Last saw [facility] on 9.24.19.) and Medicare Wellness  on 04/18/2019  HPI:  I have personally reviewed the Medicare Annual Wellness questionnaire and have noted 1. The patient's medical and social history 2. Their use of alcohol, tobacco or illicit drugs 3. Their current medications and supplements;i 4. The patient's functional ability including ADL's, fall risks, home safety risks and hearing or visual             impairment. 5. Diet and physical activities 6. Evidence for depression or mood disorders  Depression screen [facility] 2/9 04/18/2019 11/07/2017 09/27/2016 10/07/2015  Decreased Interest 0 0 0 0  Down, Depressed, Hopeless 0 0 0 0  PHQ - 2 Score 0 0 0 0    Health Maintenance  Topic Date Due  . INFLUENZA VACCINE  05/19/2019  . TETANUS/TDAP  07/08/2026  . PNA vac Low Risk Adult  Completed    End of life wishes discussed and updated in Social History.  The roster of all physicians providing medical care to patient - is listed in the CareTeams section of the chart  He is doing well.  Very active- loves golf.  Sensorineural hearing loss (SNHL), bilateral   Followed by Tonia Ghent, [facility] Filler.  Last seen on 07/11/18.  Note reviewed.  Intentional weight loss- cut back on portions.  Wt Readings from Last 3 Encounters:  04/18/19 168 lb 6.4 oz (76.4 kg)  11/07/17 181 lb 9.6 oz (82.4 kg)  10/04/16 174 lb 12 oz (79.3 kg)     GERD- well controlled on omeprazole 20 mg daily.  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  Per pt, he was advised to follow up in 4 months.  Right shoulder arthritis- just got a cortisone injection yesterday at [place]. He  wants to get through the summer with his golf tournaments before he has a total right shoulder replacement.  Elevated PSA- followed by urology, Dr. Junious Silk.  Per pt, was checked on 5/26 at urologist and was back down to 2.    Lab Results  Component Value Date   PSA 6.19 (H) 11/07/2017   PSA 3.09 09/27/2016   PSA 2.00 10/02/2015    Lab Results  Component Value Date   CHOL 163 11/07/2017   HDL 37.30 (L) 11/07/2017   LDLCALC 103 (H) 11/07/2017   LDLDIRECT 111.0 09/27/2016   TRIG 110.0 11/07/2017   CHOLHDL 4 11/07/2017   Lab Results  Component Value Date   NA 143 11/07/2017   K 5.2 (H) 11/07/2017   CL 104 11/07/2017   CO2 31 11/07/2017    Current Outpatient Medications on File Prior to Visit  Medication Sig Dispense Refill  . Glucosamine HCl (GLUCOSAMINE PO) Take by mouth 2 (two) times daily.    . Multiple Vitamin (MULTIVITAMIN) tablet Take 1 tablet by mouth daily.    Marland Kitchen omeprazole (PRILOSEC) 20 MG capsule TAKE 1 CAPSULE BY MOUTH EVERY DAY 30 capsule 2   No current facility-administered medications on file prior to visit.     No Known Allergies  No past medical history on file.  Past Surgical History:  Procedure Laterality Date  . PROSTATE  BIOPSY      No family history on file.  Social History   Socioeconomic History  . Marital status: Married    Spouse name: Not on file  . Number of children: 2  . Years of education: Not on file  . Highest education level: Not on file  Occupational History  . Occupation: Clinical biochemist  Social Needs  . Financial resource strain: Not on file  . Food insecurity    Worry: Not on file    Inability: Not on file  . Transportation needs    Medical: Not on file    Non-medical: Not on file  Tobacco Use  . Smoking status: Never Smoker  . Smokeless tobacco: Current User    Types: Snuff  Substance and Sexual Activity  . Alcohol use: Yes    Alcohol/week: 0.0 standard drinks    Comment: rarely  . Drug use: No  . Sexual  activity: Yes  Lifestyle  . Physical activity    Days per week: Not on file    Minutes per session: Not on file  . Stress: Not on file  Relationships  . Social Herbalist on phone: Not on file    Gets together: Not on file    Attends religious service: Not on file    Active member of club or organization: Not on file    Attends meetings of clubs or organizations: Not on file    Relationship status: Not on file  . Intimate partner violence    Fear of current or ex partner: Not on file    Emotionally abused: Not on file    Physically abused: Not on file    Forced sexual activity: Not on file  Other Topics Concern  . Not on file  Social History Narrative   Has a living will- would not desire prolonged life support if futile.   The PMH, PSH, Social History, Family History, Medications, and allergies have been reviewed in [facility], and have been updated if relevant.   Review of Systems  Constitutional: Negative.   HENT: Negative.   Eyes: Negative.   Respiratory: Negative.   Cardiovascular: Negative.   Gastrointestinal: Negative.   Endocrine: Negative.   Genitourinary: Negative.   Musculoskeletal: Positive for arthralgias.  Allergic/Immunologic: Negative.   Neurological: Negative.   Hematological: Negative.   Psychiatric/Behavioral: Negative.   All other systems reviewed and are negative.      Objective:    BP 130/82 (BP Location: Left Arm, Cuff Size: Normal)   Pulse 72   Temp 98.7 F (37.1 C) (Oral)   Ht 6' (1.829 m)   Wt 168 lb 6.4 oz (76.4 kg)   SpO2 98%   BMI 22.84 kg/m   Wt Readings from Last 3 Encounters:  04/18/19 168 lb 6.4 oz (76.4 kg)  11/07/17 181 lb 9.6 oz (82.4 kg)  10/04/16 174 lb 12 oz (79.3 kg)    BP Readings from Last 3 Encounters:  04/18/19 130/82  11/07/17 138/82  10/04/16 134/72    Physical Exam Vitals signs and nursing note reviewed.  Constitutional:      General: He is not in acute distress.    Appearance: Normal  appearance. He is not ill-appearing.  HENT:     Head: Normocephalic.     Right Ear: External ear normal.     Left Ear: External ear normal.     Nose: Nose normal.     Mouth/Throat:     Mouth: Mucous membranes are moist.  Eyes:     Extraocular Movements: Extraocular movements intact.     Conjunctiva/sclera: Conjunctivae normal.     Pupils: Pupils are equal, round, and reactive to light.  Neck:     Musculoskeletal: Normal range of motion.  Cardiovascular:     Rate and Rhythm: Normal rate and regular rhythm.     Pulses: Normal pulses.     Heart sounds: Normal heart sounds.  Pulmonary:     Effort: Pulmonary effort is normal.     Breath sounds: Normal breath sounds.  Abdominal:     General: Abdomen is flat.     Palpations: Abdomen is soft.  Musculoskeletal: Normal range of motion.  Skin:    General: Skin is warm and dry.  Neurological:     General: No focal deficit present.     Mental Status: He is alert and oriented to person, place, and time.  Psychiatric:        Mood and Affect: Mood normal.        Behavior: Behavior normal.        Thought Content: Thought content normal.        Judgment: Judgment normal.           Assessment & Plan:   Gastroesophageal reflux disease, esophagitis presence not specified - Plan: Comp Met (CMET), CBC w/Diff,   PSA, INCREASED - Plan: PSA,   Hypertriglyceridemia - Plan: Comp Met (CMET), CBC w/Diff, Lipid Profile, Direct LDL,  Visit for well man health check - Plan: Comp Met (CMET), CBC w/Diff, Lipid Profile, Direct LDL, PSA,   Medicare annual wellness visit, subsequent - Plan:   Sensorineural hearing loss (SNHL) of both ears - Plan:  No follow-ups on file.

## 2019-04-18 ENCOUNTER — Ambulatory Visit (INDEPENDENT_AMBULATORY_CARE_PROVIDER_SITE_OTHER): Payer: Medicare Other | Admitting: Family Medicine

## 2019-04-18 VITALS — BP 130/82 | HR 72 | Temp 98.7°F | Ht 72.0 in | Wt 168.4 lb

## 2019-04-18 DIAGNOSIS — M159 Polyosteoarthritis, unspecified: Secondary | ICD-10-CM | POA: Insufficient documentation

## 2019-04-18 DIAGNOSIS — R634 Abnormal weight loss: Secondary | ICD-10-CM

## 2019-04-18 DIAGNOSIS — Z0000 Encounter for general adult medical examination without abnormal findings: Secondary | ICD-10-CM | POA: Diagnosis not present

## 2019-04-18 DIAGNOSIS — K219 Gastro-esophageal reflux disease without esophagitis: Secondary | ICD-10-CM | POA: Diagnosis not present

## 2019-04-18 DIAGNOSIS — E781 Pure hyperglyceridemia: Secondary | ICD-10-CM | POA: Diagnosis not present

## 2019-04-18 DIAGNOSIS — M19011 Primary osteoarthritis, right shoulder: Secondary | ICD-10-CM

## 2019-04-18 DIAGNOSIS — R972 Elevated prostate specific antigen [PSA]: Secondary | ICD-10-CM

## 2019-04-18 DIAGNOSIS — H903 Sensorineural hearing loss, bilateral: Secondary | ICD-10-CM

## 2019-04-18 NOTE — Assessment & Plan Note (Signed)
Unfortunately he has not had much success with hearing aids and masks during the pandemic make it worse because he relied heavily on lip reading.  He feels the audiologist had nothing else to offer and has accepted that this is "the best his hearing will be" and still wears the hearing aids.

## 2019-04-18 NOTE — Assessment & Plan Note (Signed)
The patients weight, height, BMI and visual acuity have been recorded in the chart.  Cognitive function assessed.   I have made referrals, counseling and provided education to the patient based review of the above and I have provided the pt with a written personalized care plan for preventive services.  

## 2019-04-18 NOTE — Assessment & Plan Note (Signed)
End stage.  Followed by [facility] ortho.  Cortisone injections are helping temporarily so he can get through his golf tournaments this summer.  He will schedule total right shoulder replacement after this summer.

## 2019-04-18 NOTE — Assessment & Plan Note (Addendum)
Reviewed preventive care protocols, scheduled due services, and updated immunizations Discussed nutrition, exercise, diet, and healthy lifestyle.  Orders Placed This Encounter  Procedures  . Comp Met (CMET)  . CBC w/Diff  . Lipid Profile  . Direct LDL

## 2019-04-18 NOTE — Assessment & Plan Note (Signed)
Controlled with omeprazole. No changes made.

## 2019-04-18 NOTE — Patient Instructions (Signed)
Great to see you. I will call you with your lab results from today and you can view them online.  Happy birthday!!! 

## 2019-04-18 NOTE — Assessment & Plan Note (Signed)
Followed by urology.  Was last seen in 5/20.  PSA has decreased back down to 2.  No biopsy warranted.  Had a prostate biopsy years ago.  He has no urinary complaints today.

## 2019-04-18 NOTE — Assessment & Plan Note (Signed)
Labs today

## 2019-04-18 NOTE — Assessment & Plan Note (Signed)
Intentional.  BMI is now 22 and I advised him to not lose more weight.  He agreed.  Is not depressed.  No difficulty eating or digestive issues.  He wanted to get to be "around 170 lbs " for his golf game and has achieved this by cutting back on portions.

## 2019-04-19 LAB — CBC WITH DIFFERENTIAL/PLATELET
Absolute Monocytes: 462 cells/uL (ref 200–950)
Basophils Absolute: 31 cells/uL (ref 0–200)
Basophils Relative: 0.4 %
Eosinophils Absolute: 39 cells/uL (ref 15–500)
Eosinophils Relative: 0.5 %
HCT: 46.4 % (ref 38.5–50.0)
Hemoglobin: 15.3 g/dL (ref 13.2–17.1)
Lymphs Abs: 1140 cells/uL (ref 850–3900)
MCH: 28.5 pg (ref 27.0–33.0)
MCHC: 33 g/dL (ref 32.0–36.0)
MCV: 86.4 fL (ref 80.0–100.0)
MPV: 11.1 fL (ref 7.5–12.5)
Monocytes Relative: 6 %
Neutro Abs: 6029 cells/uL (ref 1500–7800)
Neutrophils Relative %: 78.3 %
Platelets: 274 10*3/uL (ref 140–400)
RBC: 5.37 10*6/uL (ref 4.20–5.80)
RDW: 12.9 % (ref 11.0–15.0)
Total Lymphocyte: 14.8 %
WBC: 7.7 10*3/uL (ref 3.8–10.8)

## 2019-04-19 LAB — COMPREHENSIVE METABOLIC PANEL
AG Ratio: 2 (calc) (ref 1.0–2.5)
ALT: 16 U/L (ref 9–46)
AST: 15 U/L (ref 10–35)
Albumin: 4.5 g/dL (ref 3.6–5.1)
Alkaline phosphatase (APISO): 64 U/L (ref 35–144)
BUN: 23 mg/dL (ref 7–25)
CO2: 25 mmol/L (ref 20–32)
Calcium: 9.8 mg/dL (ref 8.6–10.3)
Chloride: 106 mmol/L (ref 98–110)
Creat: 0.94 mg/dL (ref 0.70–1.11)
Globulin: 2.2 g/dL (calc) (ref 1.9–3.7)
Glucose, Bld: 99 mg/dL (ref 65–99)
Potassium: 4.5 mmol/L (ref 3.5–5.3)
Sodium: 140 mmol/L (ref 135–146)
Total Bilirubin: 0.4 mg/dL (ref 0.2–1.2)
Total Protein: 6.7 g/dL (ref 6.1–8.1)

## 2019-04-19 LAB — LIPID PANEL
Cholesterol: 179 mg/dL (ref <200)
HDL: 44 mg/dL (ref >=40)
LDL Cholesterol (Calc): 109 mg/dL (calc) — ABNORMAL HIGH
Non-HDL Cholesterol (Calc): 135 mg/dL (calc) — ABNORMAL HIGH (ref <130)
Total CHOL/HDL Ratio: 4.1 (calc) (ref <5.0)
Triglycerides: 148 mg/dL (ref <150)

## 2019-04-19 LAB — LDL CHOLESTEROL, DIRECT: Direct LDL: 108 mg/dL — ABNORMAL HIGH (ref <100)

## 2019-04-19 LAB — PSA: PSA: 2.8 ng/mL (ref <=4.0)

## 2019-04-27 ENCOUNTER — Other Ambulatory Visit: Payer: Self-pay | Admitting: Family Medicine

## 2019-06-27 ENCOUNTER — Ambulatory Visit (INDEPENDENT_AMBULATORY_CARE_PROVIDER_SITE_OTHER): Payer: Medicare Other

## 2019-06-27 DIAGNOSIS — Z23 Encounter for immunization: Secondary | ICD-10-CM | POA: Diagnosis not present

## 2019-08-02 ENCOUNTER — Other Ambulatory Visit: Payer: Self-pay | Admitting: Family Medicine

## 2019-08-14 ENCOUNTER — Encounter: Payer: Self-pay | Admitting: Family Medicine

## 2019-10-11 ENCOUNTER — Encounter: Payer: Self-pay | Admitting: Family Medicine

## 2019-10-25 DIAGNOSIS — Z85828 Personal history of other malignant neoplasm of skin: Secondary | ICD-10-CM | POA: Diagnosis not present

## 2019-10-25 DIAGNOSIS — L905 Scar conditions and fibrosis of skin: Secondary | ICD-10-CM | POA: Diagnosis not present

## 2019-10-29 ENCOUNTER — Encounter: Payer: Self-pay | Admitting: Family Medicine

## 2019-10-31 DIAGNOSIS — M9902 Segmental and somatic dysfunction of thoracic region: Secondary | ICD-10-CM | POA: Diagnosis not present

## 2019-10-31 DIAGNOSIS — M9903 Segmental and somatic dysfunction of lumbar region: Secondary | ICD-10-CM | POA: Diagnosis not present

## 2019-10-31 DIAGNOSIS — M5134 Other intervertebral disc degeneration, thoracic region: Secondary | ICD-10-CM | POA: Diagnosis not present

## 2019-10-31 DIAGNOSIS — M62830 Muscle spasm of back: Secondary | ICD-10-CM | POA: Diagnosis not present

## 2019-11-07 DIAGNOSIS — M5134 Other intervertebral disc degeneration, thoracic region: Secondary | ICD-10-CM | POA: Diagnosis not present

## 2019-11-07 DIAGNOSIS — M9903 Segmental and somatic dysfunction of lumbar region: Secondary | ICD-10-CM | POA: Diagnosis not present

## 2019-11-07 DIAGNOSIS — M9902 Segmental and somatic dysfunction of thoracic region: Secondary | ICD-10-CM | POA: Diagnosis not present

## 2019-11-07 DIAGNOSIS — M62830 Muscle spasm of back: Secondary | ICD-10-CM | POA: Diagnosis not present

## 2019-11-13 ENCOUNTER — Encounter: Payer: Self-pay | Admitting: Family Medicine

## 2019-11-14 DIAGNOSIS — M9902 Segmental and somatic dysfunction of thoracic region: Secondary | ICD-10-CM | POA: Diagnosis not present

## 2019-11-14 DIAGNOSIS — M9903 Segmental and somatic dysfunction of lumbar region: Secondary | ICD-10-CM | POA: Diagnosis not present

## 2019-11-14 DIAGNOSIS — M62830 Muscle spasm of back: Secondary | ICD-10-CM | POA: Diagnosis not present

## 2019-11-14 DIAGNOSIS — M5134 Other intervertebral disc degeneration, thoracic region: Secondary | ICD-10-CM | POA: Diagnosis not present

## 2019-11-16 ENCOUNTER — Encounter: Payer: Self-pay | Admitting: Internal Medicine

## 2019-11-16 ENCOUNTER — Ambulatory Visit: Payer: Medicare PPO | Admitting: Internal Medicine

## 2019-11-16 ENCOUNTER — Other Ambulatory Visit: Payer: Self-pay

## 2019-11-16 DIAGNOSIS — M159 Polyosteoarthritis, unspecified: Secondary | ICD-10-CM | POA: Diagnosis not present

## 2019-11-16 DIAGNOSIS — E781 Pure hyperglyceridemia: Secondary | ICD-10-CM | POA: Diagnosis not present

## 2019-11-16 DIAGNOSIS — N400 Enlarged prostate without lower urinary tract symptoms: Secondary | ICD-10-CM

## 2019-11-16 DIAGNOSIS — K219 Gastro-esophageal reflux disease without esophagitis: Secondary | ICD-10-CM | POA: Diagnosis not present

## 2019-11-16 NOTE — Assessment & Plan Note (Signed)
Continue Glucosamine Will monitor

## 2019-11-16 NOTE — Assessment & Plan Note (Signed)
Lipid profile reviewed Encouraged low fat diet

## 2019-11-16 NOTE — Assessment & Plan Note (Signed)
Continue Omeprazole Will monitor 

## 2019-11-16 NOTE — Patient Instructions (Signed)

## 2019-11-16 NOTE — Assessment & Plan Note (Signed)
Continue Finasteride for now

## 2019-11-16 NOTE — Progress Notes (Signed)
HPI  Pt presents to the clinic today to establish care and for management of the conditions listed below. He is transferring care from Dr. Deborra Medina.  Hypertriglyceridemia: His last triglycerides were 148, 04/2019. He is not taking any cholesterol lowering medication at this time. He does not consume a low fat diet .  GERD: He is not sure what triggers this. He denies breakthrough on Omeprazole. There is no upper GI on file.   OA: Generalized. He takes Glucosamine but not sure if it really helps. He sees a Restaurant manager, fast food routinely.   BPH: He is taking Finasteride as prescribed. He does not follow with urology.  Flu: 06/2019 Tetanus: 07/2009 Pneumovax: 07/2009 Prevnar: 09/2015 Zostovax: never Shingrix: 07/2019, 09/2019 COVID:10/2019 PSA Screening: 04/2019 Colon Screening: 12/2004 Vision Screening: annually Dentist:  No past medical history on file.  Current Outpatient Medications  Medication Sig Dispense Refill  . Glucosamine HCl (GLUCOSAMINE PO) Take by mouth 2 (two) times daily.    . Multiple Vitamin (MULTIVITAMIN) tablet Take 1 tablet by mouth daily.    Marland Kitchen omeprazole (PRILOSEC) 20 MG capsule TAKE 1 CAPSULE BY MOUTH EVERY DAY 90 capsule 1   No current facility-administered medications for this visit.    No Known Allergies  No family history on file.  Social History   Socioeconomic History  . Marital status: Married    Spouse name: Not on file  . Number of children: 2  . Years of education: Not on file  . Highest education level: Not on file  Occupational History  . Occupation: Clinical biochemist  Tobacco Use  . Smoking status: Never Smoker  . Smokeless tobacco: Current User    Types: Snuff  Substance and Sexual Activity  . Alcohol use: Yes    Alcohol/week: 0.0 standard drinks    Comment: rarely  . Drug use: No  . Sexual activity: Yes  Other Topics Concern  . Not on file  Social History Narrative   Has a living will- would not desire prolonged life support if futile.    Social Determinants of Health   Financial Resource Strain:   . Difficulty of Paying Living Expenses: Not on file  Food Insecurity:   . Worried About Charity fundraiser in the Last Year: Not on file  . Ran Out of Food in the Last Year: Not on file  Transportation Needs:   . Lack of Transportation (Medical): Not on file  . Lack of Transportation (Non-Medical): Not on file  Physical Activity:   . Days of Exercise per Week: Not on file  . Minutes of Exercise per Session: Not on file  Stress:   . Feeling of Stress : Not on file  Social Connections:   . Frequency of Communication with Friends and Family: Not on file  . Frequency of Social Gatherings with Friends and Family: Not on file  . Attends Religious Services: Not on file  . Active Member of Clubs or Organizations: Not on file  . Attends Archivist Meetings: Not on file  . Marital Status: Not on file  Intimate Partner Violence:   . Fear of Current or Ex-Partner: Not on file  . Emotionally Abused: Not on file  . Physically Abused: Not on file  . Sexually Abused: Not on file    ROS:  Constitutional: Denies fever, malaise, fatigue, headache or abrupt weight changes.  HEENT: Denies eye pain, eye redness, ear pain, ringing in the ears, wax buildup, runny nose, nasal congestion, bloody nose, or sore throat. Respiratory:  Denies difficulty breathing, shortness of breath, cough or sputum production.   Cardiovascular: Denies chest pain, chest tightness, palpitations or swelling in the hands or feet.  Gastrointestinal: Denies abdominal pain, bloating, constipation, diarrhea or blood in the stool.  GU: Denies frequency, urgency, pain with urination, blood in urine, odor or discharge. Musculoskeletal: Pt reports intermittent joint pain. Denies decrease in range of motion, difficulty with gait, muscle pain or joint swelling.  Skin: Denies redness, rashes, lesions or ulcercations.  Neurological: Denies dizziness, difficulty  with memory, difficulty with speech or problems with balance and coordination.  Psych: Denies anxiety, depression, SI/HI.  No other specific complaints in a complete review of systems (except as listed in HPI above).  PE:  BP 134/84   Pulse 72   Temp (!) 97.3 F (36.3 C) (Temporal)   Wt 176 lb (79.8 kg)   SpO2 97%   BMI 23.87 kg/m   Wt Readings from Last 3 Encounters:  04/18/19 168 lb 6.4 oz (76.4 kg)  11/07/17 181 lb 9.6 oz (82.4 kg)  10/04/16 174 lb 12 oz (79.3 kg)    General: Appears his stated age, well developed, well nourished in NAD. Cardiovascular: Normal rate and rhythm. S1,S2 noted.  No murmur, rubs or gallops noted. No JVD or BLE edema.  Pulmonary/Chest: Normal effort and positive vesicular breath sounds. No respiratory distress. No wheezes, rales or ronchi noted.  Musculoskeletal:  No difficulty with gait.  Neurological: Alert and oriented.  Psychiatric: Mood and affect normal. Behavior is normal. Judgment and thought content normal.     BMET    Component Value Date/Time   NA 140 04/18/2019 1258   K 4.5 04/18/2019 1258   CL 106 04/18/2019 1258   CO2 25 04/18/2019 1258   GLUCOSE 99 04/18/2019 1258   BUN 23 04/18/2019 1258   CREATININE 0.94 04/18/2019 1258   CALCIUM 9.8 04/18/2019 1258    Lipid Panel     Component Value Date/Time   CHOL 179 04/18/2019 1258   TRIG 148 04/18/2019 1258   HDL 44 04/18/2019 1258   CHOLHDL 4.1 04/18/2019 1258   VLDL 22.0 11/07/2017 0946   LDLCALC 109 (H) 04/18/2019 1258    CBC    Component Value Date/Time   WBC 7.7 04/18/2019 1258   RBC 5.37 04/18/2019 1258   HGB 15.3 04/18/2019 1258   HCT 46.4 04/18/2019 1258   PLT 274 04/18/2019 1258   MCV 86.4 04/18/2019 1258   MCH 28.5 04/18/2019 1258   MCHC 33.0 04/18/2019 1258   RDW 12.9 04/18/2019 1258   LYMPHSABS 1,140 04/18/2019 1258   MONOABS 0.4 09/27/2016 1425   EOSABS 39 04/18/2019 1258   BASOSABS 31 04/18/2019 1258    Hgb A1C Lab Results  Component Value  Date   HGBA1C 5.6 09/27/2016     Assessment and Plan:   Nicki Reaper, NP This visit occurred during the SARS-CoV-2 public health emergency.  Safety protocols were in place, including screening questions prior to the visit, additional usage of staff PPE, and extensive cleaning of exam room while observing appropriate contact time as indicated for disinfecting solutions.

## 2019-12-12 DIAGNOSIS — H02834 Dermatochalasis of left upper eyelid: Secondary | ICD-10-CM | POA: Diagnosis not present

## 2019-12-12 DIAGNOSIS — H2513 Age-related nuclear cataract, bilateral: Secondary | ICD-10-CM | POA: Diagnosis not present

## 2019-12-12 DIAGNOSIS — H18893 Other specified disorders of cornea, bilateral: Secondary | ICD-10-CM | POA: Diagnosis not present

## 2019-12-12 DIAGNOSIS — H02831 Dermatochalasis of right upper eyelid: Secondary | ICD-10-CM | POA: Diagnosis not present

## 2019-12-19 DIAGNOSIS — M62830 Muscle spasm of back: Secondary | ICD-10-CM | POA: Diagnosis not present

## 2019-12-19 DIAGNOSIS — M9903 Segmental and somatic dysfunction of lumbar region: Secondary | ICD-10-CM | POA: Diagnosis not present

## 2019-12-19 DIAGNOSIS — M5134 Other intervertebral disc degeneration, thoracic region: Secondary | ICD-10-CM | POA: Diagnosis not present

## 2019-12-19 DIAGNOSIS — M9902 Segmental and somatic dysfunction of thoracic region: Secondary | ICD-10-CM | POA: Diagnosis not present

## 2019-12-26 DIAGNOSIS — M62830 Muscle spasm of back: Secondary | ICD-10-CM | POA: Diagnosis not present

## 2019-12-26 DIAGNOSIS — M5134 Other intervertebral disc degeneration, thoracic region: Secondary | ICD-10-CM | POA: Diagnosis not present

## 2019-12-26 DIAGNOSIS — M9903 Segmental and somatic dysfunction of lumbar region: Secondary | ICD-10-CM | POA: Diagnosis not present

## 2019-12-26 DIAGNOSIS — M9902 Segmental and somatic dysfunction of thoracic region: Secondary | ICD-10-CM | POA: Diagnosis not present

## 2020-01-02 DIAGNOSIS — M62830 Muscle spasm of back: Secondary | ICD-10-CM | POA: Diagnosis not present

## 2020-01-02 DIAGNOSIS — M9903 Segmental and somatic dysfunction of lumbar region: Secondary | ICD-10-CM | POA: Diagnosis not present

## 2020-01-02 DIAGNOSIS — M5134 Other intervertebral disc degeneration, thoracic region: Secondary | ICD-10-CM | POA: Diagnosis not present

## 2020-01-02 DIAGNOSIS — M9902 Segmental and somatic dysfunction of thoracic region: Secondary | ICD-10-CM | POA: Diagnosis not present

## 2020-01-09 DIAGNOSIS — M5134 Other intervertebral disc degeneration, thoracic region: Secondary | ICD-10-CM | POA: Diagnosis not present

## 2020-01-09 DIAGNOSIS — M9903 Segmental and somatic dysfunction of lumbar region: Secondary | ICD-10-CM | POA: Diagnosis not present

## 2020-01-09 DIAGNOSIS — M9902 Segmental and somatic dysfunction of thoracic region: Secondary | ICD-10-CM | POA: Diagnosis not present

## 2020-01-09 DIAGNOSIS — M62830 Muscle spasm of back: Secondary | ICD-10-CM | POA: Diagnosis not present

## 2020-01-16 DIAGNOSIS — M62830 Muscle spasm of back: Secondary | ICD-10-CM | POA: Diagnosis not present

## 2020-01-16 DIAGNOSIS — M9902 Segmental and somatic dysfunction of thoracic region: Secondary | ICD-10-CM | POA: Diagnosis not present

## 2020-01-16 DIAGNOSIS — M9903 Segmental and somatic dysfunction of lumbar region: Secondary | ICD-10-CM | POA: Diagnosis not present

## 2020-01-16 DIAGNOSIS — M5134 Other intervertebral disc degeneration, thoracic region: Secondary | ICD-10-CM | POA: Diagnosis not present

## 2020-01-23 DIAGNOSIS — M9902 Segmental and somatic dysfunction of thoracic region: Secondary | ICD-10-CM | POA: Diagnosis not present

## 2020-01-23 DIAGNOSIS — M9903 Segmental and somatic dysfunction of lumbar region: Secondary | ICD-10-CM | POA: Diagnosis not present

## 2020-01-23 DIAGNOSIS — M62830 Muscle spasm of back: Secondary | ICD-10-CM | POA: Diagnosis not present

## 2020-01-23 DIAGNOSIS — M5134 Other intervertebral disc degeneration, thoracic region: Secondary | ICD-10-CM | POA: Diagnosis not present

## 2020-02-22 ENCOUNTER — Encounter: Payer: Self-pay | Admitting: Internal Medicine

## 2020-02-22 ENCOUNTER — Telehealth: Payer: Self-pay | Admitting: Internal Medicine

## 2020-02-22 MED ORDER — OMEPRAZOLE 20 MG PO CPDR: DELAYED_RELEASE_CAPSULE | ORAL | 0 refills | Status: DC

## 2020-02-22 NOTE — Telephone Encounter (Signed)
Rx sent through [facility]  

## 2020-02-22 NOTE — Telephone Encounter (Signed)
Patient came in office asking for a refill to be sent to [facility] for his acid reflux medication.

## 2020-05-16 ENCOUNTER — Other Ambulatory Visit: Payer: Self-pay | Admitting: Internal Medicine

## 2020-07-08 ENCOUNTER — Ambulatory Visit (INDEPENDENT_AMBULATORY_CARE_PROVIDER_SITE_OTHER): Payer: Medicare PPO | Admitting: Internal Medicine

## 2020-07-08 ENCOUNTER — Other Ambulatory Visit: Payer: Self-pay

## 2020-07-08 ENCOUNTER — Encounter: Payer: Self-pay | Admitting: Internal Medicine

## 2020-07-08 VITALS — BP 136/82 | HR 74 | Temp 98.1°F | Ht 69.5 in | Wt 169.0 lb

## 2020-07-08 DIAGNOSIS — Z23 Encounter for immunization: Secondary | ICD-10-CM

## 2020-07-08 DIAGNOSIS — Z125 Encounter for screening for malignant neoplasm of prostate: Secondary | ICD-10-CM

## 2020-07-08 DIAGNOSIS — Z0000 Encounter for general adult medical examination without abnormal findings: Secondary | ICD-10-CM

## 2020-07-08 DIAGNOSIS — E781 Pure hyperglyceridemia: Secondary | ICD-10-CM | POA: Diagnosis not present

## 2020-07-08 NOTE — Progress Notes (Signed)
HPI:  Pt presents to the clinic today for his subsequent annual Medicare Wellness Exam.  No past medical history on file.  Current Outpatient Medications  Medication Sig Dispense Refill  . ASPIRIN 81 PO Take by mouth.    . finasteride (PROSCAR) 5 MG tablet     . Glucosamine HCl (GLUCOSAMINE PO) Take by mouth 2 (two) times daily.    . Multiple Vitamin (MULTIVITAMIN) tablet Take 1 tablet by mouth daily.    Marland Kitchen omeprazole (PRILOSEC) 20 MG capsule TAKE 1 CAPSULE BY MOUTH EVERY DAY 90 capsule 0   No current facility-administered medications for this visit.    No Known Allergies  No family history on file.  Social History   Socioeconomic History  . Marital status: Married    Spouse name: Not on file  . Number of children: 2  . Years of education: Not on file  . Highest education level: Not on file  Occupational History  . Occupation: Product/process development scientist  Tobacco Use  . Smoking status: Never Smoker  . Smokeless tobacco: Current User    Types: Snuff  Substance and Sexual Activity  . Alcohol use: Yes    Alcohol/week: 0.0 standard drinks    Comment: rarely  . Drug use: No  . Sexual activity: Yes  Other Topics Concern  . Not on file  Social History Narrative   Has a living will- would not desire prolonged life support if futile.   Social Determinants of Health   Financial Resource Strain:   . Difficulty of Paying Living Expenses: Not on file  Food Insecurity:   . Worried About Programme researcher, broadcasting/film/video in the Last Year: Not on file  . Ran Out of Food in the Last Year: Not on file  Transportation Needs:   . Lack of Transportation (Medical): Not on file  . Lack of Transportation (Non-Medical): Not on file  Physical Activity:   . Days of Exercise per Week: Not on file  . Minutes of Exercise per Session: Not on file  Stress:   . Feeling of Stress : Not on file  Social Connections:   . Frequency of Communication with Friends and Family: Not on file  . Frequency of Social  Gatherings with Friends and Family: Not on file  . Attends Religious Services: Not on file  . Active Member of Clubs or Organizations: Not on file  . Attends Banker Meetings: Not on file  . Marital Status: Not on file  Intimate Partner Violence:   . Fear of Current or Ex-Partner: Not on file  . Emotionally Abused: Not on file  . Physically Abused: Not on file  . Sexually Abused: Not on file    Hospitiliaztions: None  Health Maintenance:    Flu: 06/2019  Tetanus: 06/2016  Pneumovax: 07/2009  Prevnar: 09/2015  Zostavax: never  Shingrix: 2020  Covid: Moderna  PSA: 10/2017  Colon Screening: 12/2004  Eye Doctor: annually  Dental Exam: as needed, dentures   Providers:   PCP: Nicki Reaper, NP   Urologist: Mena Goes  Dermatology: Dr. Terri Piedra   I have personally reviewed and have noted:  1. The patient's medical and social history 2. Their use of alcohol, tobacco or illicit drugs 3. Their current medications and supplements 4. The patient's functional ability including ADL's, fall risks, home safety risks and hearing or visual impairment. 5. Diet and physical activities 6. Evidence for depression or mood disorder  Subjective:   Review of Systems:   Constitutional: Denies fever, malaise,  fatigue, headache or abrupt weight changes.  HEENT: Pt reports difficulty hearing. Denies eye pain, eye redness, ear pain, ringing in the ears, wax buildup, runny nose, nasal congestion, bloody nose, or sore throat. Respiratory: Denies difficulty breathing, shortness of breath, cough or sputum production.   Cardiovascular: Denies chest pain, chest tightness, palpitations or swelling in the hands or feet.  Gastrointestinal: Denies abdominal pain, bloating, constipation, diarrhea or blood in the stool.  GU: Denies urgency, frequency, pain with urination, burning sensation, blood in urine, odor or discharge. Musculoskeletal: Denies decrease in range of motion, difficulty with gait,  muscle pain or joint pain and swelling.  Skin: Denies redness, rashes, lesions or ulcercations.  Neurological: Denies dizziness, difficulty with memory, difficulty with speech or problems with balance and coordination.  Psych: Denies anxiety, depression, SI/HI.  No other specific complaints in a complete review of systems (except as listed in HPI above).  Objective:  PE:   BP 136/82   Pulse (!) 4   Temp 98.1 F (36.7 C) (Temporal)   Ht 5' 9.5" (1.765 m)   Wt 169 lb (76.7 kg)   SpO2 97%   BMI 24.60 kg/m    Wt Readings from Last 3 Encounters:  11/16/19 176 lb (79.8 kg)  04/18/19 168 lb 6.4 oz (76.4 kg)  11/07/17 181 lb 9.6 oz (82.4 kg)    General: Appears his stated age, well developed, well nourished in NAD. Skin: Warm, dry and intact. No rashes noted. HEENT: Head: normal shape and size; Eyes: sclera white, no icterus, conjunctiva pink, PERRLA and EOMs intact;  Neck: Neck supple, trachea midline. No masses, lumps or thyromegaly present.  Cardiovascular: Normal rate and rhythm. S1,S2 noted.  No murmur, rubs or gallops noted. No JVD or BLE edema. No carotid bruits noted. Pulmonary/Chest: Normal effort and positive vesicular breath sounds. No respiratory distress. No wheezes, rales or ronchi noted.  Abdomen: Soft and nontender. Normal bowel sounds. No distention or masses noted. Liver, spleen and kidneys non palpable. Musculoskeletal: Strength 5/5 BUE/BLE. No difficulty with gait. Neurological: Alert and oriented. Cranial nerves II-XII grossly intact. Coordination normal.  Psychiatric: Mood and affect normal. Behavior is normal. Judgment and thought content normal.    BMET    Component Value Date/Time   NA 140 04/18/2019 1258   K 4.5 04/18/2019 1258   CL 106 04/18/2019 1258   CO2 25 04/18/2019 1258   GLUCOSE 99 04/18/2019 1258   BUN 23 04/18/2019 1258   CREATININE 0.94 04/18/2019 1258   CALCIUM 9.8 04/18/2019 1258    Lipid Panel     Component Value Date/Time   CHOL  179 04/18/2019 1258   TRIG 148 04/18/2019 1258   HDL 44 04/18/2019 1258   CHOLHDL 4.1 04/18/2019 1258   VLDL 22.0 11/07/2017 0946   LDLCALC 109 (H) 04/18/2019 1258    CBC    Component Value Date/Time   WBC 7.7 04/18/2019 1258   RBC 5.37 04/18/2019 1258   HGB 15.3 04/18/2019 1258   HCT 46.4 04/18/2019 1258   PLT 274 04/18/2019 1258   MCV 86.4 04/18/2019 1258   MCH 28.5 04/18/2019 1258   MCHC 33.0 04/18/2019 1258   RDW 12.9 04/18/2019 1258   LYMPHSABS 1,140 04/18/2019 1258   MONOABS 0.4 09/27/2016 1425   EOSABS 39 04/18/2019 1258   BASOSABS 31 04/18/2019 1258    Hgb A1C Lab Results  Component Value Date   HGBA1C 5.6 09/27/2016      Assessment and Plan:   Medicare Annual Wellness Visit:  Diet: He does eat meat. He consumes fruits and veggies daily. He does eat some fried foods. He drinks mostly milk, juice, some water. Physical activity: Golf daily Depression/mood screen: Negative, PHQ 9 score of 0 Hearing: Has hearing aides Visual acuity: Grossly normal, performs annual eye exam  ADLs: Capable Fall risk: None Home safety: Good Cognitive evaluation: Intact to orientation, naming, recall and repetition EOL planning: Adv directives, full code/ I agree  Preventative Medicine: Flu shot today. Tetanus, pneumovax, prevnar, shingrix and covid UTD. He no longer wants to screen for colon cancer. Encouraged him to consume a balanced diet and exercise regimen. Advised him to see an eye doctor annually, dentist as needed. Will check CBC, CMET, Lipid and PSA today. Due dates for screening exam given to patient as part of his AVS.   Next appointment: 1 year, Medicare Wellness Exam   Nicki Reaper, NP This visit occurred during the SARS-CoV-2 public health emergency.  Safety protocols were in place, including screening questions prior to the visit, additional usage of staff PPE, and extensive cleaning of exam room while observing appropriate contact time as indicated for  disinfecting solutions.

## 2020-07-08 NOTE — Patient Instructions (Signed)
Health Maintenance After Age 84 After age 84, you are at a higher risk for certain long-term diseases and infections as well as injuries from falls. Falls are a major cause of broken bones and head injuries in people who are older than age 84. Getting regular preventive care can help to keep you healthy and well. Preventive care includes getting regular testing and making lifestyle changes as recommended by your health care provider. Talk with your health care provider about:  Which screenings and tests you should have. A screening is a test that checks for a disease when you have no symptoms.  A diet and exercise plan that is right for you. What should I know about screenings and tests to prevent falls? Screening and testing are the best ways to find a health problem early. Early diagnosis and treatment give you the best chance of managing medical conditions that are common after age 84. Certain conditions and lifestyle choices may make you more likely to have a fall. Your health care provider may recommend:  Regular vision checks. Poor vision and conditions such as cataracts can make you more likely to have a fall. If you wear glasses, make sure to get your prescription updated if your vision changes.  Medicine review. Work with your health care provider to regularly review all of the medicines you are taking, including over-the-counter medicines. Ask your health care provider about any side effects that may make you more likely to have a fall. Tell your health care provider if any medicines that you take make you feel dizzy or sleepy.  Osteoporosis screening. Osteoporosis is a condition that causes the bones to get weaker. This can make the bones weak and cause them to break more easily.  Blood pressure screening. Blood pressure changes and medicines to control blood pressure can make you feel dizzy.  Strength and balance checks. Your health care provider may recommend certain tests to check your  strength and balance while standing, walking, or changing positions.  Foot health exam. Foot pain and numbness, as well as not wearing proper footwear, can make you more likely to have a fall.  Depression screening. You may be more likely to have a fall if you have a fear of falling, feel emotionally low, or feel unable to do activities that you used to do.  Alcohol use screening. Using too much alcohol can affect your balance and may make you more likely to have a fall. What actions can I take to lower my risk of falls? General instructions  Talk with your health care provider about your risks for falling. Tell your health care provider if: ? You fall. Be sure to tell your health care provider about all falls, even ones that seem minor. ? You feel dizzy, sleepy, or off-balance.  Take over-the-counter and prescription medicines only as told by your health care provider. These include any supplements.  Eat a healthy diet and maintain a healthy weight. A healthy diet includes low-fat dairy products, low-fat (lean) meats, and fiber from whole grains, beans, and lots of fruits and vegetables. Home safety  Remove any tripping hazards, such as rugs, cords, and clutter.  Install safety equipment such as grab bars in bathrooms and safety rails on stairs.  Keep rooms and walkways well-lit. Activity   Follow a regular exercise program to stay fit. This will help you maintain your balance. Ask your health care provider what types of exercise are appropriate for you.  If you need a cane or   walker, use it as recommended by your health care provider.  Wear supportive shoes that have nonskid soles. Lifestyle  Do not drink alcohol if your health care provider tells you not to drink.  If you drink alcohol, limit how much you have: ? 0-1 drink a day for women. ? 0-2 drinks a day for men.  Be aware of how much alcohol is in your drink. In the [place] one drink equals one typical bottle of beer (12  oz), one-half glass of wine (5 oz), or one shot of hard liquor (1 oz).  Do not use any products that contain nicotine or tobacco, such as cigarettes and e-cigarettes. If you need help quitting, ask your health care provider. Summary  Having a healthy lifestyle and getting preventive care can help to protect your health and wellness after age 84.  Screening and testing are the best way to find a health problem early and help you avoid having a fall. Early diagnosis and treatment give you the best chance for managing medical conditions that are more common for people who are older than age 84.  Falls are a major cause of broken bones and head injuries in people who are older than age 84. Take precautions to prevent a fall at home.  Work with your health care provider to learn what changes you can make to improve your health and wellness and to prevent falls. This information is not intended to replace advice given to you by your health care provider. Make sure you discuss any questions you have with your health care provider. Document Revised: 01/25/2019 Document Reviewed: 08/17/2017 Elsevier Patient Education  2020 Elsevier Inc.  

## 2020-07-09 LAB — CBC
HCT: 44.2 % (ref 39.0–52.0)
Hemoglobin: 14.6 g/dL (ref 13.0–17.0)
MCHC: 33 g/dL (ref 30.0–36.0)
MCV: 88.2 fl (ref 78.0–100.0)
Platelets: 232 10*3/uL (ref 150.0–400.0)
RBC: 5 Mil/uL (ref 4.22–5.81)
RDW: 14.1 % (ref 11.5–15.5)
WBC: 5.4 10*3/uL (ref 4.0–10.5)

## 2020-07-09 LAB — COMPREHENSIVE METABOLIC PANEL
ALT: 15 U/L (ref 0–53)
AST: 15 U/L (ref 0–37)
Albumin: 4.3 g/dL (ref 3.5–5.2)
Alkaline Phosphatase: 65 U/L (ref 39–117)
BUN: 21 mg/dL (ref 6–23)
CO2: 30 mEq/L (ref 19–32)
Calcium: 9.5 mg/dL (ref 8.4–10.5)
Chloride: 103 mEq/L (ref 96–112)
Creatinine, Ser: 0.89 mg/dL (ref 0.40–1.50)
GFR: 81.41 mL/min (ref >60.00)
Glucose, Bld: 115 mg/dL — ABNORMAL HIGH (ref 70–99)
Potassium: 4.6 mEq/L (ref 3.5–5.1)
Sodium: 140 mEq/L (ref 135–145)
Total Bilirubin: 0.4 mg/dL (ref 0.2–1.2)
Total Protein: 6.6 g/dL (ref 6.0–8.3)

## 2020-07-09 LAB — PSA, MEDICARE: PSA: 1.72 ng/ml (ref 0.10–4.00)

## 2020-07-09 LAB — LIPID PANEL
Cholesterol: 173 mg/dL (ref 0–200)
HDL: 43 mg/dL (ref >39.00)
LDL Cholesterol: 113 mg/dL — ABNORMAL HIGH (ref 0–99)
NonHDL: 129.91
Total CHOL/HDL Ratio: 4
Triglycerides: 85 mg/dL (ref 0.0–149.0)
VLDL: 17 mg/dL (ref 0.0–40.0)

## 2020-08-18 ENCOUNTER — Other Ambulatory Visit: Payer: Self-pay | Admitting: Internal Medicine

## 2021-03-09 ENCOUNTER — Encounter: Payer: Self-pay | Admitting: Internal Medicine

## 2021-03-10 ENCOUNTER — Telehealth: Payer: Self-pay | Admitting: Family Medicine

## 2021-03-10 NOTE — Telephone Encounter (Signed)
Noted. Thanks.

## 2021-03-10 NOTE — Telephone Encounter (Signed)
Please call pt.  I talked with his wife.  They both tested positive for covid.  Please check with patient and see about virtual visit. [phone] Thanks.

## 2021-03-10 NOTE — Telephone Encounter (Signed)
Spoke to patient by telephone and was advised that he had a sore throat and cough last week. Patient stated that he has had a loss of taste but that is getting better. Patient stated that the cough started around 02/28/21.  Patient stated that he is feeling better and does not feel that he needs a virtual visit. Patient denies, fever, body aches or SOB. Patient was advised if he does not continue to improve to call the office back. Patient was given ER precautions and he verbalized understanding. Patient was advised to drink lots of fluids, rest and eat well-balanced meals.

## 2021-04-17 ENCOUNTER — Encounter: Payer: Self-pay | Admitting: Internal Medicine

## 2021-07-10 ENCOUNTER — Ambulatory Visit (INDEPENDENT_AMBULATORY_CARE_PROVIDER_SITE_OTHER): Payer: Medicare PPO | Admitting: Internal Medicine

## 2021-07-10 ENCOUNTER — Other Ambulatory Visit: Payer: Self-pay

## 2021-07-10 ENCOUNTER — Encounter: Payer: Self-pay | Admitting: Internal Medicine

## 2021-07-10 VITALS — BP 142/71 | HR 73 | Temp 97.5°F | Resp 18 | Ht 69.5 in | Wt 165.8 lb

## 2021-07-10 DIAGNOSIS — K219 Gastro-esophageal reflux disease without esophagitis: Secondary | ICD-10-CM

## 2021-07-10 DIAGNOSIS — N400 Enlarged prostate without lower urinary tract symptoms: Secondary | ICD-10-CM | POA: Diagnosis not present

## 2021-07-10 DIAGNOSIS — M159 Polyosteoarthritis, unspecified: Secondary | ICD-10-CM | POA: Diagnosis not present

## 2021-07-10 DIAGNOSIS — Z0000 Encounter for general adult medical examination without abnormal findings: Secondary | ICD-10-CM | POA: Diagnosis not present

## 2021-07-10 DIAGNOSIS — E781 Pure hyperglyceridemia: Secondary | ICD-10-CM

## 2021-07-10 DIAGNOSIS — Z23 Encounter for immunization: Secondary | ICD-10-CM | POA: Diagnosis not present

## 2021-07-10 DIAGNOSIS — Z125 Encounter for screening for malignant neoplasm of prostate: Secondary | ICD-10-CM

## 2021-07-10 LAB — COMPLETE METABOLIC PANEL WITH GFR
AG Ratio: 2 (calc) (ref 1.0–2.5)
ALT: 13 U/L (ref 9–46)
AST: 15 U/L (ref 10–35)
Albumin: 4.1 g/dL (ref 3.6–5.1)
Alkaline phosphatase (APISO): 67 U/L (ref 35–144)
BUN: 21 mg/dL (ref 7–25)
CO2: 28 mmol/L (ref 20–32)
Calcium: 9.1 mg/dL (ref 8.6–10.3)
Chloride: 105 mmol/L (ref 98–110)
Creat: 0.76 mg/dL (ref 0.70–1.22)
Globulin: 2.1 g/dL (calc) (ref 1.9–3.7)
Glucose, Bld: 109 mg/dL (ref 65–139)
Potassium: 4.2 mmol/L (ref 3.5–5.3)
Sodium: 140 mmol/L (ref 135–146)
Total Bilirubin: 0.5 mg/dL (ref 0.2–1.2)
Total Protein: 6.2 g/dL (ref 6.1–8.1)
eGFR: 88 mL/min/{1.73_m2} (ref >=60)

## 2021-07-10 LAB — CBC
HCT: 42.3 % (ref 38.5–50.0)
Hemoglobin: 14 g/dL (ref 13.2–17.1)
MCH: 28.9 pg (ref 27.0–33.0)
MCHC: 33.1 g/dL (ref 32.0–36.0)
MCV: 87.2 fL (ref 80.0–100.0)
MPV: 10.8 fL (ref 7.5–12.5)
Platelets: 222 10*3/uL (ref 140–400)
RBC: 4.85 10*6/uL (ref 4.20–5.80)
RDW: 12.7 % (ref 11.0–15.0)
WBC: 6 10*3/uL (ref 3.8–10.8)

## 2021-07-10 LAB — LIPID PANEL
Cholesterol: 161 mg/dL (ref <200)
HDL: 44 mg/dL (ref >=40)
LDL Cholesterol (Calc): 101 mg/dL (calc) — ABNORMAL HIGH
Non-HDL Cholesterol (Calc): 117 mg/dL (calc) (ref <130)
Total CHOL/HDL Ratio: 3.7 (calc) (ref <5.0)
Triglycerides: 73 mg/dL (ref <150)

## 2021-07-10 LAB — PSA: PSA: 2.49 ng/mL (ref <=4.00)

## 2021-07-10 NOTE — Patient Instructions (Signed)
Health Maintenance, Male Adopting a healthy lifestyle and getting preventive care are important in promoting health and wellness. Ask your health care provider about: The right schedule for you to have regular tests and exams. Things you can do on your own to prevent diseases and keep yourself healthy. What should I know about diet, weight, and exercise? Eat a healthy diet  Eat a diet that includes plenty of vegetables, fruits, low-fat dairy products, and lean protein. Do not eat a lot of foods that are high in solid fats, added sugars, or sodium. Maintain a healthy weight Body mass index (BMI) is a measurement that can be used to identify possible weight problems. It estimates body fat based on height and weight. Your health care provider can help determine your BMI and help you achieve or maintain a healthy weight. Get regular exercise Get regular exercise. This is one of the most important things you can do for your health. Most adults should: Exercise for at least 150 minutes each week. The exercise should increase your heart rate and make you sweat (moderate-intensity exercise). Do strengthening exercises at least twice a week. This is in addition to the moderate-intensity exercise. Spend less time sitting. Even light physical activity can be beneficial. Watch cholesterol and blood lipids Have your blood tested for lipids and cholesterol at 85 years of age, then have this test every 5 years. You may need to have your cholesterol levels checked more often if: Your lipid or cholesterol levels are high. You are older than 85 years of age. You are at high risk for heart disease. What should I know about cancer screening? Many types of cancers can be detected early and may often be prevented. Depending on your health history and family history, you may need to have cancer screening at various ages. This may include screening for: Colorectal cancer. Prostate cancer. Skin cancer. Lung  cancer. What should I know about heart disease, diabetes, and high blood pressure? Blood pressure and heart disease High blood pressure causes heart disease and increases the risk of stroke. This is more likely to develop in people who have high blood pressure readings, are of African descent, or are overweight. Talk with your health care provider about your target blood pressure readings. Have your blood pressure checked: Every 3-5 years if you are 18-39 years of age. Every year if you are 40 years old or older. If you are between the ages of 65 and 75 and are a current or former smoker, ask your health care provider if you should have a one-time screening for abdominal aortic aneurysm (AAA). Diabetes Have regular diabetes screenings. This checks your fasting blood sugar level. Have the screening done: Once every three years after age 45 if you are at a normal weight and have a low risk for diabetes. More often and at a younger age if you are overweight or have a high risk for diabetes. What should I know about preventing infection? Hepatitis B If you have a higher risk for hepatitis B, you should be screened for this virus. Talk with your health care provider to find out if you are at risk for hepatitis B infection. Hepatitis C Blood testing is recommended for: Everyone born from 1945 through 1965. Anyone with known risk factors for hepatitis C. Sexually transmitted infections (STIs) You should be screened each year for STIs, including gonorrhea and chlamydia, if: You are sexually active and are younger than 85 years of age. You are older than 85 years   of age and your health care provider tells you that you are at risk for this type of infection. Your sexual activity has changed since you were last screened, and you are at increased risk for chlamydia or gonorrhea. Ask your health care provider if you are at risk. Ask your health care provider about whether you are at high risk for HIV.  Your health care provider may recommend a prescription medicine to help prevent HIV infection. If you choose to take medicine to prevent HIV, you should first get tested for HIV. You should then be tested every 3 months for as long as you are taking the medicine. Follow these instructions at home: Lifestyle Do not use any products that contain nicotine or tobacco, such as cigarettes, e-cigarettes, and chewing tobacco. If you need help quitting, ask your health care provider. Do not use street drugs. Do not share needles. Ask your health care provider for help if you need support or information about quitting drugs. Alcohol use Do not drink alcohol if your health care provider tells you not to drink. If you drink alcohol: Limit how much you have to 0-2 drinks a day. Be aware of how much alcohol is in your drink. In the [place] one drink equals one 12 oz bottle of beer (355 mL), one 5 oz glass of wine (148 mL), or one 1 oz glass of hard liquor (44 mL). General instructions Schedule regular health, dental, and eye exams. Stay current with your vaccines. Tell your health care provider if: You often feel depressed. You have ever been abused or do not feel safe at home. Summary Adopting a healthy lifestyle and getting preventive care are important in promoting health and wellness. Follow your health care provider's instructions about healthy diet, exercising, and getting tested or screened for diseases. Follow your health care provider's instructions on monitoring your cholesterol and blood pressure. This information is not intended to replace advice given to you by your health care provider. Make sure you discuss any questions you have with your health care provider. Document Revised: 12/12/2020 Document Reviewed: 09/27/2018 Elsevier Patient Education  2022 Elsevier Inc.  

## 2021-07-10 NOTE — Assessment & Plan Note (Signed)
C-Met and lipid profile today Encouraged him to consume a low-fat diet 

## 2021-07-10 NOTE — Progress Notes (Signed)
HPI:  Patient presents the clinic today for his subsequent annual Medicare wellness exam.  He is also due to follow-up chronic conditions.  Hypertriglyceridemia: His last LDL was 113, triglycerides 95, 06/2020.  He is not taking any cholesterol-lowering medication at this time.  He does not consume a low-fat diet.  GERD: He is not sure what triggers this.  He has occasional breakthrough on Omeprazole.  He takes Tums as needed with good relief of symptoms. There is no upper GI on file.  OA: Generalized. He is having worsening pain in his right hip. He is seeing a Restaurant manager, fast food. He takes Glucosamine but is not sure if this really helps or not.  He sees a Restaurant manager, fast food routinely.  BPH: Mainly nocturia. Managed on Finasteride.  He does not follow with urology.  No past medical history on file.  Current Outpatient Medications  Medication Sig Dispense Refill   ASPIRIN 81 PO Take by mouth.     finasteride (PROSCAR) 5 MG tablet      Glucosamine HCl (GLUCOSAMINE PO) Take by mouth 2 (two) times daily.     Multiple Vitamin (MULTIVITAMIN) tablet Take 1 tablet by mouth daily.     omeprazole (PRILOSEC) 20 MG capsule TAKE 1 CAPSULE BY MOUTH EVERY DAY 90 capsule 3   No current facility-administered medications for this visit.    No Known Allergies  No family history on file.  Social History   Socioeconomic History   Marital status: Married    Spouse name: Not on file   Number of children: 2   Years of education: Not on file   Highest education level: Not on file  Occupational History   Occupation: Clinical biochemist  Tobacco Use   Smoking status: Never   Smokeless tobacco: Current    Types: Snuff  Substance and Sexual Activity   Alcohol use: Yes    Alcohol/week: 0.0 standard drinks    Comment: rarely   Drug use: No   Sexual activity: Yes  Other Topics Concern   Not on file  Social History Narrative   Has a living will- would not desire prolonged life support if futile.   Social  Determinants of Health   Financial Resource Strain: Not on file  Food Insecurity: Not on file  Transportation Needs: Not on file  Physical Activity: Not on file  Stress: Not on file  Social Connections: Not on file  Intimate Partner Violence: Not on file    Hospitiliaztions: None  Health Maintenance:    Flu: 06/2020  Tetanus: 06/2016  Pneumovax: 07/2009  Prevnar: 09/2015  Zostavax: Never  Shingrix: 07/2019, 09/2019  COVID: Moderna x3  PSA: 06/2020 Colon Screening: 2006  Eye Doctor: Annually  Dental Exam: Biannually   Providers:   PCP: Webb Silversmith, NP   I have personally reviewed and have noted:  1. The patient's medical and social history 2. Their use of alcohol, tobacco or illicit drugs 3. Their current medications and supplements 4. The patient's functional ability including ADL's, fall risks, home safety risks and hearing or visual impairment. 5. Diet and physical activities 6. Evidence for depression or mood disorder  Subjective:   Review of Systems:   Constitutional: Denies fever, malaise, fatigue, headache or abrupt weight changes.  HEENT: Denies eye pain, eye redness, ear pain, ringing in the ears, wax buildup, runny nose, nasal congestion, bloody nose, or sore throat. Respiratory: Denies difficulty breathing, shortness of breath, cough or sputum production.   Cardiovascular: Denies chest pain, chest tightness, palpitations or swelling in  the hands or feet.  Gastrointestinal: Denies abdominal pain, bloating, constipation, diarrhea or blood in the stool.  GU: Pt reports nocturia. Denies urgency, frequency, pain with urination, burning sensation, blood in urine, odor or discharge. Musculoskeletal: Patient reports joint pain.  Denies decrease in range of motion, difficulty with gait, muscle pain or joint swelling.  Skin: Denies redness, rashes, lesions or ulcercations.  Neurological: Denies dizziness, difficulty with memory, difficulty with speech or problems  with balance and coordination.  Psych: Denies anxiety, depression, SI/HI.  No other specific complaints in a complete review of systems (except as listed in HPI above).  Objective:  PE:  BP (!) 142/71 (BP Location: Right Arm, Patient Position: Sitting, Cuff Size: Normal)   Pulse 73   Temp (!) 97.5 F (36.4 C) (Temporal)   Resp 18   Ht 5' 9.5" (1.765 m)   Wt 165 lb 12.8 oz (75.2 kg)   SpO2 99%   BMI 24.13 kg/m   Wt Readings from Last 3 Encounters:  07/08/20 169 lb (76.7 kg)  11/16/19 176 lb (79.8 kg)  04/18/19 168 lb 6.4 oz (76.4 kg)    General: Appears his stated age, well developed, well nourished in NAD. Skin: Warm, dry and intact.  Sun damaged skin noted.  No ulcerations noted. HEENT: Head: normal shape and size; Eyes: Wearing glasses, EOMs intact; Ears: Hearing aids in; Neck: Neck supple, trachea midline. No masses, lumps or thyromegaly present.  Cardiovascular: Normal rate and rhythm. S1,S2 noted.  No murmur, rubs or gallops noted. No JVD or BLE edema. No carotid bruits noted. Pulmonary/Chest: Normal effort and positive vesicular breath sounds. No respiratory distress. No wheezes, rales or ronchi noted.  Abdomen: Soft and nontender. Normal bowel sounds. No distention or masses noted. Liver, spleen and kidneys non palpable. Musculoskeletal: Strength 5/5 BUE/BLE. No difficulty with gait. Neurological: Alert and oriented. Cranial nerves II-XII grossly intact. Coordination normal.  Psychiatric: Mood and affect normal. Behavior is normal. Judgment and thought content normal.    BMET    Component Value Date/Time   NA 140 07/08/2020 1546   K 4.6 07/08/2020 1546   CL 103 07/08/2020 1546   CO2 30 07/08/2020 1546   GLUCOSE 115 (H) 07/08/2020 1546   BUN 21 07/08/2020 1546   CREATININE 0.89 07/08/2020 1546   CREATININE 0.94 04/18/2019 1258   CALCIUM 9.5 07/08/2020 1546    Lipid Panel     Component Value Date/Time   CHOL 173 07/08/2020 1546   TRIG 85.0 07/08/2020 1546    HDL 43.00 07/08/2020 1546   CHOLHDL 4 07/08/2020 1546   VLDL 17.0 07/08/2020 1546   LDLCALC 113 (H) 07/08/2020 1546   LDLCALC 109 (H) 04/18/2019 1258    CBC    Component Value Date/Time   WBC 5.4 07/08/2020 1546   RBC 5.00 07/08/2020 1546   HGB 14.6 07/08/2020 1546   HCT 44.2 07/08/2020 1546   PLT 232.0 07/08/2020 1546   MCV 88.2 07/08/2020 1546   MCH 28.5 04/18/2019 1258   MCHC 33.0 07/08/2020 1546   RDW 14.1 07/08/2020 1546   LYMPHSABS 1,140 04/18/2019 1258   MONOABS 0.4 09/27/2016 1425   EOSABS 39 04/18/2019 1258   BASOSABS 31 04/18/2019 1258    Hgb A1C Lab Results  Component Value Date   HGBA1C 5.6 09/27/2016      Assessment and Plan:   Medicare Annual Wellness Visit:  Diet: He does eat meat. He consumes fruits and veggies. He does eat some fried foods. He drinks mostly water,  milk. Physical activity: Golfing Depression/mood screen: Negative, PHQ 9 score of 0 Hearing: Intact to whispered voice Visual acuity: Grossly normal, performs annual eye exam  ADLs: Capable Fall risk: None Home safety: Good Cognitive evaluation: Trouble with recall. Intact to orientation, naming, and repetition EOL planning: Adv directives, full code/ I agree  Preventative Medicine: Flu shot today.  Tetanus, Pneumovax, Prevnar and Zostavax UTD.  Encouraged him to get his COVID booster.  He no longer needs colon cancer screening.  Encouraged him to consume a balanced diet and exercise regimen.  Advised him to see an eye doctor and dentist annually.  Will check CBC, c-Met, lipid and PSA today.  Due dates for screening exam given patient's part of his ADLs.   Next appointment: 1 year, Medicare wellness exam   This visit occurred during the SARS-CoV-2 public health emergency.  Safety protocols were in place, including screening questions prior to the visit, additional usage of staff PPE, and extensive cleaning of exam room while observing appropriate contact time as indicated for  disinfecting solutions.

## 2021-07-10 NOTE — Assessment & Plan Note (Signed)
Continue Finasteride

## 2021-07-10 NOTE — Assessment & Plan Note (Signed)
Continue Glucosamine as needed Encourage regular physical activity

## 2021-07-10 NOTE — Assessment & Plan Note (Signed)
Continue Omeprazole and Tums Will monitor

## 2021-07-14 ENCOUNTER — Ambulatory Visit: Payer: Self-pay | Admitting: *Deleted

## 2021-07-14 NOTE — Telephone Encounter (Signed)
Summary: Clinical Advice   Patient wife was dx with COVID from a home test. Patient spouse PCP advised to call his PCP and request anti viral medication. Patient is currently not experiencing any symptoms and seeking clinical advice regarding medication.      Reason for Disposition  [1] CLOSE CONTACT COVID-19 EXPOSURE within last 14 days AND [2] NO symptoms  Answer Assessment - Initial Assessment Questions 1. COVID-19 EXPOSURE: "Please describe how you were exposed to someone with a COVID-19 infection."     Wife tested + COVID today 2. PLACE of CONTACT: "Where were you when you were exposed to COVID-19?" (e.g., home, school, medical waiting room; which city?)     home 3. TYPE of CONTACT: "How much contact was there?" (e.g., sitting next to, live in same house, work in same office, same building)     Live in same house 4. DURATION of CONTACT: "How long were you in contact with the COVID-19 patient?" (e.g., a few seconds, passed by person, a few minutes, 15 minutes or longer, live with the patient)     Lives with patient 5. MASK: "Were you wearing a mask?" "Was the other person wearing a mask?" Note: wearing a mask reduces the risk of an otherwise close contact.     no 6. DATE of CONTACT: "When did you have contact with a COVID-19 patient?" (e.g., how many days ago)    Symptoms yesterday- + today 7. COMMUNITY SPREAD: "Are there lots of cases of COVID-19 (community spread) where you live?" (See public health department website, if unsure)       yes 8. SYMPTOMS: "Do you have any symptoms?" (e.g., fever, cough, breathing difficulty, loss of taste or smell)     no 9. VACCINE: "Have you gotten the COVID-19 vaccine?" If Yes, ask: "Which one, how many shots, when did you get it?"     Yes- Moderna 10. BOOSTER: "Have you received your COVID-19 booster?" If Yes, ask: "Which one and when did you get it?"       Yes- last 04/16/21 11. PREGNANCY OR POSTPARTUM: "Is there any chance you are pregnant?" "When  was your last menstrual period?" "Did you deliver in the last 2 weeks?"       no 12. HIGH RISK: "Do you have any heart or lung problems?" (e.g., asthma , COPD, heart failure) "Do you have a weak immune system or other risk factors?" (e.g., HIV positive, chemotherapy, renal failure, diabetes mellitus, sickle cell anemia, obesity)       no 13. TRAVEL: "Have you traveled out of the country recently?" If Yes, ask: "When and where?"  Note: Travel becomes less relevant if there is widespread community transmission where the patient lives.       N/a  Protocols used: Coronavirus (COVID-19) Exposure-A-AH

## 2021-07-14 NOTE — Telephone Encounter (Signed)
Patient is calling to report his wife has tested + COVID- patient states he does not have symptoms at this time. Advised per exposure/vaccine protocol- wear mask 10 days, test in 5 days  and be alert for symptoms for 2-14 days. Patient instructed to call office if he has changes in status.

## 2021-07-15 NOTE — Telephone Encounter (Signed)
Agree with advice given

## 2021-08-16 ENCOUNTER — Other Ambulatory Visit: Payer: Self-pay | Admitting: Internal Medicine

## 2021-08-16 NOTE — Telephone Encounter (Signed)
Requested Prescriptions  Pending Prescriptions Disp Refills  . omeprazole (PRILOSEC) 20 MG capsule [Pharmacy Med Name: OMEPRAZOLE DR 20 MG CAPSULE] 90 capsule 3    Sig: TAKE 1 CAPSULE BY MOUTH EVERY DAY     Gastroenterology: Proton Pump Inhibitors Passed - 08/16/2021  8:18 AM      Passed - Valid encounter within last 12 months    Recent Outpatient Visits          1 month ago Medicare annual wellness visit, subsequent   [facility] [place], Salvadore Oxford, NP      Future Appointments            In 11 months Baity, Salvadore Oxford, NP [facility], [facility]

## 2021-10-21 DIAGNOSIS — M9902 Segmental and somatic dysfunction of thoracic region: Secondary | ICD-10-CM | POA: Diagnosis not present

## 2021-10-21 DIAGNOSIS — M9903 Segmental and somatic dysfunction of lumbar region: Secondary | ICD-10-CM | POA: Diagnosis not present

## 2021-10-21 DIAGNOSIS — M5134 Other intervertebral disc degeneration, thoracic region: Secondary | ICD-10-CM | POA: Diagnosis not present

## 2021-10-21 DIAGNOSIS — M62830 Muscle spasm of back: Secondary | ICD-10-CM | POA: Diagnosis not present

## 2021-10-28 DIAGNOSIS — M9903 Segmental and somatic dysfunction of lumbar region: Secondary | ICD-10-CM | POA: Diagnosis not present

## 2021-10-28 DIAGNOSIS — M9902 Segmental and somatic dysfunction of thoracic region: Secondary | ICD-10-CM | POA: Diagnosis not present

## 2021-10-28 DIAGNOSIS — M62830 Muscle spasm of back: Secondary | ICD-10-CM | POA: Diagnosis not present

## 2021-10-28 DIAGNOSIS — M5134 Other intervertebral disc degeneration, thoracic region: Secondary | ICD-10-CM | POA: Diagnosis not present

## 2021-11-04 DIAGNOSIS — M9903 Segmental and somatic dysfunction of lumbar region: Secondary | ICD-10-CM | POA: Diagnosis not present

## 2021-11-04 DIAGNOSIS — M62830 Muscle spasm of back: Secondary | ICD-10-CM | POA: Diagnosis not present

## 2021-11-04 DIAGNOSIS — M9902 Segmental and somatic dysfunction of thoracic region: Secondary | ICD-10-CM | POA: Diagnosis not present

## 2021-11-04 DIAGNOSIS — M5134 Other intervertebral disc degeneration, thoracic region: Secondary | ICD-10-CM | POA: Diagnosis not present

## 2021-11-11 DIAGNOSIS — M9903 Segmental and somatic dysfunction of lumbar region: Secondary | ICD-10-CM | POA: Diagnosis not present

## 2021-11-11 DIAGNOSIS — M5134 Other intervertebral disc degeneration, thoracic region: Secondary | ICD-10-CM | POA: Diagnosis not present

## 2021-11-11 DIAGNOSIS — M62830 Muscle spasm of back: Secondary | ICD-10-CM | POA: Diagnosis not present

## 2021-11-11 DIAGNOSIS — M9902 Segmental and somatic dysfunction of thoracic region: Secondary | ICD-10-CM | POA: Diagnosis not present

## 2021-11-18 DIAGNOSIS — M9902 Segmental and somatic dysfunction of thoracic region: Secondary | ICD-10-CM | POA: Diagnosis not present

## 2021-11-18 DIAGNOSIS — M5134 Other intervertebral disc degeneration, thoracic region: Secondary | ICD-10-CM | POA: Diagnosis not present

## 2021-11-18 DIAGNOSIS — M9903 Segmental and somatic dysfunction of lumbar region: Secondary | ICD-10-CM | POA: Diagnosis not present

## 2021-11-18 DIAGNOSIS — M62830 Muscle spasm of back: Secondary | ICD-10-CM | POA: Diagnosis not present

## 2021-11-25 DIAGNOSIS — M5134 Other intervertebral disc degeneration, thoracic region: Secondary | ICD-10-CM | POA: Diagnosis not present

## 2021-11-25 DIAGNOSIS — M62830 Muscle spasm of back: Secondary | ICD-10-CM | POA: Diagnosis not present

## 2021-11-25 DIAGNOSIS — M9903 Segmental and somatic dysfunction of lumbar region: Secondary | ICD-10-CM | POA: Diagnosis not present

## 2021-11-25 DIAGNOSIS — M9902 Segmental and somatic dysfunction of thoracic region: Secondary | ICD-10-CM | POA: Diagnosis not present

## 2021-12-02 DIAGNOSIS — M62830 Muscle spasm of back: Secondary | ICD-10-CM | POA: Diagnosis not present

## 2021-12-02 DIAGNOSIS — M9902 Segmental and somatic dysfunction of thoracic region: Secondary | ICD-10-CM | POA: Diagnosis not present

## 2021-12-02 DIAGNOSIS — M5134 Other intervertebral disc degeneration, thoracic region: Secondary | ICD-10-CM | POA: Diagnosis not present

## 2021-12-02 DIAGNOSIS — M9903 Segmental and somatic dysfunction of lumbar region: Secondary | ICD-10-CM | POA: Diagnosis not present

## 2021-12-09 DIAGNOSIS — M9902 Segmental and somatic dysfunction of thoracic region: Secondary | ICD-10-CM | POA: Diagnosis not present

## 2021-12-09 DIAGNOSIS — M5134 Other intervertebral disc degeneration, thoracic region: Secondary | ICD-10-CM | POA: Diagnosis not present

## 2021-12-09 DIAGNOSIS — M9903 Segmental and somatic dysfunction of lumbar region: Secondary | ICD-10-CM | POA: Diagnosis not present

## 2021-12-09 DIAGNOSIS — M62830 Muscle spasm of back: Secondary | ICD-10-CM | POA: Diagnosis not present

## 2021-12-15 DIAGNOSIS — L923 Foreign body granuloma of the skin and subcutaneous tissue: Secondary | ICD-10-CM | POA: Diagnosis not present

## 2021-12-15 DIAGNOSIS — D225 Melanocytic nevi of trunk: Secondary | ICD-10-CM | POA: Diagnosis not present

## 2021-12-15 DIAGNOSIS — L814 Other melanin hyperpigmentation: Secondary | ICD-10-CM | POA: Diagnosis not present

## 2021-12-15 DIAGNOSIS — Z08 Encounter for follow-up examination after completed treatment for malignant neoplasm: Secondary | ICD-10-CM | POA: Diagnosis not present

## 2021-12-15 DIAGNOSIS — L570 Actinic keratosis: Secondary | ICD-10-CM | POA: Diagnosis not present

## 2021-12-15 DIAGNOSIS — Z85828 Personal history of other malignant neoplasm of skin: Secondary | ICD-10-CM | POA: Diagnosis not present

## 2021-12-15 DIAGNOSIS — L821 Other seborrheic keratosis: Secondary | ICD-10-CM | POA: Diagnosis not present

## 2021-12-16 DIAGNOSIS — M9903 Segmental and somatic dysfunction of lumbar region: Secondary | ICD-10-CM | POA: Diagnosis not present

## 2021-12-16 DIAGNOSIS — M9902 Segmental and somatic dysfunction of thoracic region: Secondary | ICD-10-CM | POA: Diagnosis not present

## 2021-12-16 DIAGNOSIS — M5134 Other intervertebral disc degeneration, thoracic region: Secondary | ICD-10-CM | POA: Diagnosis not present

## 2021-12-16 DIAGNOSIS — M62830 Muscle spasm of back: Secondary | ICD-10-CM | POA: Diagnosis not present

## 2021-12-23 DIAGNOSIS — H2513 Age-related nuclear cataract, bilateral: Secondary | ICD-10-CM | POA: Diagnosis not present

## 2021-12-23 DIAGNOSIS — H02106 Unspecified ectropion of left eye, unspecified eyelid: Secondary | ICD-10-CM | POA: Diagnosis not present

## 2021-12-23 DIAGNOSIS — H02103 Unspecified ectropion of right eye, unspecified eyelid: Secondary | ICD-10-CM | POA: Diagnosis not present

## 2022-01-06 DIAGNOSIS — M62830 Muscle spasm of back: Secondary | ICD-10-CM | POA: Diagnosis not present

## 2022-01-06 DIAGNOSIS — M9903 Segmental and somatic dysfunction of lumbar region: Secondary | ICD-10-CM | POA: Diagnosis not present

## 2022-01-06 DIAGNOSIS — M5134 Other intervertebral disc degeneration, thoracic region: Secondary | ICD-10-CM | POA: Diagnosis not present

## 2022-01-06 DIAGNOSIS — M9902 Segmental and somatic dysfunction of thoracic region: Secondary | ICD-10-CM | POA: Diagnosis not present

## 2022-01-13 DIAGNOSIS — M62830 Muscle spasm of back: Secondary | ICD-10-CM | POA: Diagnosis not present

## 2022-01-13 DIAGNOSIS — M9902 Segmental and somatic dysfunction of thoracic region: Secondary | ICD-10-CM | POA: Diagnosis not present

## 2022-01-13 DIAGNOSIS — M5134 Other intervertebral disc degeneration, thoracic region: Secondary | ICD-10-CM | POA: Diagnosis not present

## 2022-01-13 DIAGNOSIS — M9903 Segmental and somatic dysfunction of lumbar region: Secondary | ICD-10-CM | POA: Diagnosis not present

## 2022-01-27 DIAGNOSIS — M5134 Other intervertebral disc degeneration, thoracic region: Secondary | ICD-10-CM | POA: Diagnosis not present

## 2022-01-27 DIAGNOSIS — M9902 Segmental and somatic dysfunction of thoracic region: Secondary | ICD-10-CM | POA: Diagnosis not present

## 2022-01-27 DIAGNOSIS — M9903 Segmental and somatic dysfunction of lumbar region: Secondary | ICD-10-CM | POA: Diagnosis not present

## 2022-01-27 DIAGNOSIS — M62830 Muscle spasm of back: Secondary | ICD-10-CM | POA: Diagnosis not present

## 2022-02-10 DIAGNOSIS — M62830 Muscle spasm of back: Secondary | ICD-10-CM | POA: Diagnosis not present

## 2022-02-10 DIAGNOSIS — M5134 Other intervertebral disc degeneration, thoracic region: Secondary | ICD-10-CM | POA: Diagnosis not present

## 2022-02-10 DIAGNOSIS — M9903 Segmental and somatic dysfunction of lumbar region: Secondary | ICD-10-CM | POA: Diagnosis not present

## 2022-02-10 DIAGNOSIS — M9902 Segmental and somatic dysfunction of thoracic region: Secondary | ICD-10-CM | POA: Diagnosis not present

## 2022-02-24 DIAGNOSIS — M5134 Other intervertebral disc degeneration, thoracic region: Secondary | ICD-10-CM | POA: Diagnosis not present

## 2022-02-24 DIAGNOSIS — M62830 Muscle spasm of back: Secondary | ICD-10-CM | POA: Diagnosis not present

## 2022-02-24 DIAGNOSIS — M9902 Segmental and somatic dysfunction of thoracic region: Secondary | ICD-10-CM | POA: Diagnosis not present

## 2022-02-24 DIAGNOSIS — M9903 Segmental and somatic dysfunction of lumbar region: Secondary | ICD-10-CM | POA: Diagnosis not present

## 2022-03-10 DIAGNOSIS — M9903 Segmental and somatic dysfunction of lumbar region: Secondary | ICD-10-CM | POA: Diagnosis not present

## 2022-03-10 DIAGNOSIS — M5134 Other intervertebral disc degeneration, thoracic region: Secondary | ICD-10-CM | POA: Diagnosis not present

## 2022-03-10 DIAGNOSIS — M9902 Segmental and somatic dysfunction of thoracic region: Secondary | ICD-10-CM | POA: Diagnosis not present

## 2022-03-10 DIAGNOSIS — M62830 Muscle spasm of back: Secondary | ICD-10-CM | POA: Diagnosis not present

## 2022-03-24 DIAGNOSIS — M62830 Muscle spasm of back: Secondary | ICD-10-CM | POA: Diagnosis not present

## 2022-03-24 DIAGNOSIS — M5134 Other intervertebral disc degeneration, thoracic region: Secondary | ICD-10-CM | POA: Diagnosis not present

## 2022-03-24 DIAGNOSIS — M9903 Segmental and somatic dysfunction of lumbar region: Secondary | ICD-10-CM | POA: Diagnosis not present

## 2022-03-24 DIAGNOSIS — M9902 Segmental and somatic dysfunction of thoracic region: Secondary | ICD-10-CM | POA: Diagnosis not present

## 2022-04-07 DIAGNOSIS — M9903 Segmental and somatic dysfunction of lumbar region: Secondary | ICD-10-CM | POA: Diagnosis not present

## 2022-04-07 DIAGNOSIS — M5134 Other intervertebral disc degeneration, thoracic region: Secondary | ICD-10-CM | POA: Diagnosis not present

## 2022-04-07 DIAGNOSIS — M9902 Segmental and somatic dysfunction of thoracic region: Secondary | ICD-10-CM | POA: Diagnosis not present

## 2022-04-07 DIAGNOSIS — M62830 Muscle spasm of back: Secondary | ICD-10-CM | POA: Diagnosis not present

## 2022-04-21 DIAGNOSIS — M5134 Other intervertebral disc degeneration, thoracic region: Secondary | ICD-10-CM | POA: Diagnosis not present

## 2022-04-21 DIAGNOSIS — M62830 Muscle spasm of back: Secondary | ICD-10-CM | POA: Diagnosis not present

## 2022-04-21 DIAGNOSIS — M9902 Segmental and somatic dysfunction of thoracic region: Secondary | ICD-10-CM | POA: Diagnosis not present

## 2022-04-21 DIAGNOSIS — M9903 Segmental and somatic dysfunction of lumbar region: Secondary | ICD-10-CM | POA: Diagnosis not present

## 2022-05-12 DIAGNOSIS — M5134 Other intervertebral disc degeneration, thoracic region: Secondary | ICD-10-CM | POA: Diagnosis not present

## 2022-05-12 DIAGNOSIS — M9903 Segmental and somatic dysfunction of lumbar region: Secondary | ICD-10-CM | POA: Diagnosis not present

## 2022-05-12 DIAGNOSIS — M9902 Segmental and somatic dysfunction of thoracic region: Secondary | ICD-10-CM | POA: Diagnosis not present

## 2022-05-12 DIAGNOSIS — M62830 Muscle spasm of back: Secondary | ICD-10-CM | POA: Diagnosis not present

## 2022-06-02 DIAGNOSIS — M9903 Segmental and somatic dysfunction of lumbar region: Secondary | ICD-10-CM | POA: Diagnosis not present

## 2022-06-02 DIAGNOSIS — M5134 Other intervertebral disc degeneration, thoracic region: Secondary | ICD-10-CM | POA: Diagnosis not present

## 2022-06-02 DIAGNOSIS — M9902 Segmental and somatic dysfunction of thoracic region: Secondary | ICD-10-CM | POA: Diagnosis not present

## 2022-06-02 DIAGNOSIS — M62830 Muscle spasm of back: Secondary | ICD-10-CM | POA: Diagnosis not present

## 2022-06-14 DIAGNOSIS — D225 Melanocytic nevi of trunk: Secondary | ICD-10-CM | POA: Diagnosis not present

## 2022-06-14 DIAGNOSIS — Z08 Encounter for follow-up examination after completed treatment for malignant neoplasm: Secondary | ICD-10-CM | POA: Diagnosis not present

## 2022-06-14 DIAGNOSIS — L570 Actinic keratosis: Secondary | ICD-10-CM | POA: Diagnosis not present

## 2022-06-14 DIAGNOSIS — L821 Other seborrheic keratosis: Secondary | ICD-10-CM | POA: Diagnosis not present

## 2022-06-14 DIAGNOSIS — L814 Other melanin hyperpigmentation: Secondary | ICD-10-CM | POA: Diagnosis not present

## 2022-06-14 DIAGNOSIS — L578 Other skin changes due to chronic exposure to nonionizing radiation: Secondary | ICD-10-CM | POA: Diagnosis not present

## 2022-06-14 DIAGNOSIS — Z85828 Personal history of other malignant neoplasm of skin: Secondary | ICD-10-CM | POA: Diagnosis not present

## 2022-06-16 DIAGNOSIS — M5134 Other intervertebral disc degeneration, thoracic region: Secondary | ICD-10-CM | POA: Diagnosis not present

## 2022-06-16 DIAGNOSIS — M62830 Muscle spasm of back: Secondary | ICD-10-CM | POA: Diagnosis not present

## 2022-06-16 DIAGNOSIS — M9903 Segmental and somatic dysfunction of lumbar region: Secondary | ICD-10-CM | POA: Diagnosis not present

## 2022-06-16 DIAGNOSIS — M9902 Segmental and somatic dysfunction of thoracic region: Secondary | ICD-10-CM | POA: Diagnosis not present

## 2022-06-30 DIAGNOSIS — M62830 Muscle spasm of back: Secondary | ICD-10-CM | POA: Diagnosis not present

## 2022-06-30 DIAGNOSIS — M9902 Segmental and somatic dysfunction of thoracic region: Secondary | ICD-10-CM | POA: Diagnosis not present

## 2022-06-30 DIAGNOSIS — M5134 Other intervertebral disc degeneration, thoracic region: Secondary | ICD-10-CM | POA: Diagnosis not present

## 2022-06-30 DIAGNOSIS — M9903 Segmental and somatic dysfunction of lumbar region: Secondary | ICD-10-CM | POA: Diagnosis not present

## 2022-07-13 ENCOUNTER — Ambulatory Visit (INDEPENDENT_AMBULATORY_CARE_PROVIDER_SITE_OTHER): Payer: Medicare PPO | Admitting: Internal Medicine

## 2022-07-13 ENCOUNTER — Encounter: Payer: Self-pay | Admitting: Internal Medicine

## 2022-07-13 VITALS — BP 168/78 | HR 71 | Temp 97.1°F | Ht 69.5 in | Wt 162.0 lb

## 2022-07-13 DIAGNOSIS — Z23 Encounter for immunization: Secondary | ICD-10-CM | POA: Diagnosis not present

## 2022-07-13 DIAGNOSIS — Z0001 Encounter for general adult medical examination with abnormal findings: Secondary | ICD-10-CM | POA: Diagnosis not present

## 2022-07-13 DIAGNOSIS — R030 Elevated blood-pressure reading, without diagnosis of hypertension: Secondary | ICD-10-CM | POA: Diagnosis not present

## 2022-07-13 DIAGNOSIS — R7309 Other abnormal glucose: Secondary | ICD-10-CM | POA: Diagnosis not present

## 2022-07-13 DIAGNOSIS — Z125 Encounter for screening for malignant neoplasm of prostate: Secondary | ICD-10-CM | POA: Diagnosis not present

## 2022-07-13 DIAGNOSIS — E781 Pure hyperglyceridemia: Secondary | ICD-10-CM | POA: Diagnosis not present

## 2022-07-13 DIAGNOSIS — R202 Paresthesia of skin: Secondary | ICD-10-CM

## 2022-07-13 NOTE — Patient Instructions (Signed)
Health Maintenance After Age 86 After age 86, you are at a higher risk for certain long-term diseases and infections as well as injuries from falls. Falls are a major cause of broken bones and head injuries in people who are older than age 86. Getting regular preventive care can help to keep you healthy and well. Preventive care includes getting regular testing and making lifestyle changes as recommended by your health care provider. Talk with your health care provider about: Which screenings and tests you should have. A screening is a test that checks for a disease when you have no symptoms. A diet and exercise plan that is right for you. What should I know about screenings and tests to prevent falls? Screening and testing are the best ways to find a health problem early. Early diagnosis and treatment give you the best chance of managing medical conditions that are common after age 86. Certain conditions and lifestyle choices may make you more likely to have a fall. Your health care provider may recommend: Regular vision checks. Poor vision and conditions such as cataracts can make you more likely to have a fall. If you wear glasses, make sure to get your prescription updated if your vision changes. Medicine review. Work with your health care provider to regularly review all of the medicines you are taking, including over-the-counter medicines. Ask your health care provider about any side effects that may make you more likely to have a fall. Tell your health care provider if any medicines that you take make you feel dizzy or sleepy. Strength and balance checks. Your health care provider may recommend certain tests to check your strength and balance while standing, walking, or changing positions. Foot health exam. Foot pain and numbness, as well as not wearing proper footwear, can make you more likely to have a fall. Screenings, including: Osteoporosis screening. Osteoporosis is a condition that causes  the bones to get weaker and break more easily. Blood pressure screening. Blood pressure changes and medicines to control blood pressure can make you feel dizzy. Depression screening. You may be more likely to have a fall if you have a fear of falling, feel depressed, or feel unable to do activities that you used to do. Alcohol use screening. Using too much alcohol can affect your balance and may make you more likely to have a fall. Follow these instructions at home: Lifestyle Do not drink alcohol if: Your health care provider tells you not to drink. If you drink alcohol: Limit how much you have to: 0-1 drink a day for women. 0-2 drinks a day for men. Know how much alcohol is in your drink. In the [place] one drink equals one 12 oz bottle of beer (355 mL), one 5 oz glass of wine (148 mL), or one 1 oz glass of hard liquor (44 mL). Do not use any products that contain nicotine or tobacco. These products include cigarettes, chewing tobacco, and vaping devices, such as e-cigarettes. If you need help quitting, ask your health care provider. Activity  Follow a regular exercise program to stay fit. This will help you maintain your balance. Ask your health care provider what types of exercise are appropriate for you. If you need a cane or walker, use it as recommended by your health care provider. Wear supportive shoes that have nonskid soles. Safety  Remove any tripping hazards, such as rugs, cords, and clutter. Install safety equipment such as grab bars in bathrooms and safety rails on stairs. Keep rooms and walkways   well-lit. General instructions Talk with your health care provider about your risks for falling. Tell your health care provider if: You fall. Be sure to tell your health care provider about all falls, even ones that seem minor. You feel dizzy, tiredness (fatigue), or off-balance. Take over-the-counter and prescription medicines only as told by your health care provider. These include  supplements. Eat a healthy diet and maintain a healthy weight. A healthy diet includes low-fat dairy products, low-fat (lean) meats, and fiber from whole grains, beans, and lots of fruits and vegetables. Stay current with your vaccines. Schedule regular health, dental, and eye exams. Summary Having a healthy lifestyle and getting preventive care can help to protect your health and wellness after age 86. Screening and testing are the best way to find a health problem early and help you avoid having a fall. Early diagnosis and treatment give you the best chance for managing medical conditions that are more common for people who are older than age 86. Falls are a major cause of broken bones and head injuries in people who are older than age 86. Take precautions to prevent a fall at home. Work with your health care provider to learn what changes you can make to improve your health and wellness and to prevent falls. This information is not intended to replace advice given to you by your health care provider. Make sure you discuss any questions you have with your health care provider. Document Revised: 02/23/2021 Document Reviewed: 02/23/2021 Elsevier Patient Education  2023 Elsevier Inc.  

## 2022-07-13 NOTE — Progress Notes (Signed)
Subjective:    Patient ID: Jacob Gray, male    DOB: Feb 27, 1934, 88 y.o.   MRN: [id number]  HPI  Patient presents to clinic today for his annual exam.  Of note, his BP today is 168/78.  He did have elevated blood pressure last year.  He has never been diagnosed with HTN.  Flu: 06/2021 Tetanus: 06/2016 COVID: Moderna x3 Pneumovax: 07/2009 Prevnar: 09/2015 Shingrix: 07/2019, 09/2019 PSA screening: 06/2021 Colon screening: 12/2004 Vision screening: annually Dentist: as needed  Diet: He does eat meat. He consumes fruits and veggies. He does eat some fried foods. He drinks mostly water and milk. Exercise: Golf  Review of Systems     No past medical history on file.  Current Outpatient Medications  Medication Sig Dispense Refill   ASPIRIN 81 PO Take by mouth.     finasteride (PROSCAR) 5 MG tablet      Glucosamine HCl (GLUCOSAMINE PO) Take by mouth 2 (two) times daily.     Multiple Vitamin (MULTIVITAMIN) tablet Take 1 tablet by mouth daily.     omeprazole (PRILOSEC) 20 MG capsule TAKE 1 CAPSULE BY MOUTH EVERY DAY 90 capsule 3   No current facility-administered medications for this visit.    No Known Allergies  No family history on file.  Social History   Socioeconomic History   Marital status: Married    Spouse name: Not on file   Number of children: 2   Years of education: Not on file   Highest education level: Not on file  Occupational History   Occupation: Clinical biochemist  Tobacco Use   Smoking status: Never   Smokeless tobacco: Current    Types: Snuff  Vaping Use   Vaping Use: Never used  Substance and Sexual Activity   Alcohol use: Yes    Alcohol/week: 0.0 standard drinks of alcohol    Comment: rarely   Drug use: No   Sexual activity: Yes  Other Topics Concern   Not on file  Social History Narrative   Has a living will- would not desire prolonged life support if futile.   Social Determinants of Health   Financial Resource Strain: Not on  file  Food Insecurity: Not on file  Transportation Needs: Not on file  Physical Activity: Not on file  Stress: Not on file  Social Connections: Not on file  Intimate Partner Violence: Not on file     Constitutional: Denies fever, malaise, fatigue, headache or abrupt weight changes.  HEENT: Denies eye pain, eye redness, ear pain, ringing in the ears, wax buildup, runny nose, nasal congestion, bloody nose, or sore throat. Respiratory: Denies difficulty breathing, shortness of breath, cough or sputum production.   Cardiovascular: Denies chest pain, chest tightness, palpitations or swelling in the hands or feet.  Gastrointestinal: Denies abdominal pain, bloating, constipation, diarrhea or blood in the stool.  GU: Denies urgency, frequency, pain with urination, burning sensation, blood in urine, odor or discharge. Musculoskeletal: Patient reports joint pain.  Denies decrease in range of motion, difficulty with gait, muscle pain or joint swelling.  Skin: Denies redness, rashes, lesions or ulcercations.  Neurological: Pt reports paresthesia of feet. Denies dizziness, difficulty with memory, difficulty with speech or problems with balance and coordination.  Psych: Denies anxiety, depression, SI/HI.  No other specific complaints in a complete review of systems (except as listed in HPI above).  Objective:   Physical Exam   BP (!) 168/78 (BP Location: Right Arm, Patient Position: Sitting, Cuff Size: Normal)   Pulse  71   Temp (!) 97.1 F (36.2 C) (Temporal)   Ht 5' 9.5" (1.765 m)   Wt 162 lb (73.5 kg)   SpO2 100%   BMI 23.58 kg/m   Wt Readings from Last 3 Encounters:  07/10/21 165 lb 12.8 oz (75.2 kg)  07/08/20 169 lb (76.7 kg)  11/16/19 176 lb (79.8 kg)    General: Appears his  stated age, well developed, well nourished in NAD. Skin: Warm, dry and intact.  HEENT: Head: normal shape and size; Eyes: sclera white, no icterus, conjunctiva pink, PERRLA and EOMs intact;  Neck:  Neck  supple, trachea midline. No masses, lumps or thyromegaly present.  Cardiovascular: Normal rate and rhythm. S1,S2 noted.  Murmur noted. No JVD or BLE edema. No carotid bruits noted. Pulmonary/Chest: Normal effort and positive vesicular breath sounds. No respiratory distress. No wheezes, rales or ronchi noted.  Abdomen: Normal bowel sounds. Musculoskeletal: Strength 5/5 BUE/BLE.  No difficulty with gait.  Neurological: Alert and oriented. Cranial nerves II-XII grossly intact. Coordination normal.  Psychiatric: Mood and affect normal. Behavior is normal. Judgment and thought content normal.    BMET    Component Value Date/Time   NA 140 07/10/2021 1400   K 4.2 07/10/2021 1400   CL 105 07/10/2021 1400   CO2 28 07/10/2021 1400   GLUCOSE 109 07/10/2021 1400   BUN 21 07/10/2021 1400   CREATININE 0.76 07/10/2021 1400   CALCIUM 9.1 07/10/2021 1400    Lipid Panel     Component Value Date/Time   CHOL 161 07/10/2021 1400   TRIG 73 07/10/2021 1400   HDL 44 07/10/2021 1400   CHOLHDL 3.7 07/10/2021 1400   VLDL 17.0 07/08/2020 1546   LDLCALC 101 (H) 07/10/2021 1400    CBC    Component Value Date/Time   WBC 6.0 07/10/2021 1400   RBC 4.85 07/10/2021 1400   HGB 14.0 07/10/2021 1400   HCT 42.3 07/10/2021 1400   PLT 222 07/10/2021 1400   MCV 87.2 07/10/2021 1400   MCH 28.9 07/10/2021 1400   MCHC 33.1 07/10/2021 1400   RDW 12.7 07/10/2021 1400   LYMPHSABS 1,140 04/18/2019 1258   MONOABS 0.4 09/27/2016 1425   EOSABS 39 04/18/2019 1258   BASOSABS 31 04/18/2019 1258    Hgb A1C Lab Results  Component Value Date   HGBA1C 5.6 09/27/2016           Assessment & Plan:   Preventative Health Maintenance:  Flu shot today Tetanus UTD Encouraged him to get his COVID booster Pneumovax and Prevnar UTD Shingrix UTD He no longer needs colon cancer and screening given his age Encouraged him to consume a balanced diet and exercise regimen Advised him to see an eye doctor and dentist  annually We will check CBC, c-Met, lipid, A1c  and PSA today  Elevated Blood Pressure Reading in Office without Diagnosis of HTN:  Reinforced DASH diet We will recheck BP in 1 month, if remains elevated will start antihypertensive therapy  RTC in 1 month, follow-up HTN, 6 months, follow-up chronic conditions Webb Silversmith, NP

## 2022-07-14 DIAGNOSIS — M62830 Muscle spasm of back: Secondary | ICD-10-CM | POA: Diagnosis not present

## 2022-07-14 DIAGNOSIS — M9902 Segmental and somatic dysfunction of thoracic region: Secondary | ICD-10-CM | POA: Diagnosis not present

## 2022-07-14 DIAGNOSIS — M5134 Other intervertebral disc degeneration, thoracic region: Secondary | ICD-10-CM | POA: Diagnosis not present

## 2022-07-14 DIAGNOSIS — M9903 Segmental and somatic dysfunction of lumbar region: Secondary | ICD-10-CM | POA: Diagnosis not present

## 2022-07-14 LAB — LIPID PANEL
Cholesterol: 175 mg/dL (ref <200)
HDL: 45 mg/dL (ref >=40)
LDL Cholesterol (Calc): 108 mg/dL (calc) — ABNORMAL HIGH
Non-HDL Cholesterol (Calc): 130 mg/dL (calc) — ABNORMAL HIGH (ref <130)
Total CHOL/HDL Ratio: 3.9 (calc) (ref <5.0)
Triglycerides: 108 mg/dL (ref <150)

## 2022-07-14 LAB — COMPLETE METABOLIC PANEL WITH GFR
AG Ratio: 1.8 (calc) (ref 1.0–2.5)
ALT: 18 U/L (ref 9–46)
AST: 15 U/L (ref 10–35)
Albumin: 4.4 g/dL (ref 3.6–5.1)
Alkaline phosphatase (APISO): 66 U/L (ref 35–144)
BUN: 16 mg/dL (ref 7–25)
CO2: 27 mmol/L (ref 20–32)
Calcium: 9.8 mg/dL (ref 8.6–10.3)
Chloride: 104 mmol/L (ref 98–110)
Creat: 0.98 mg/dL (ref 0.70–1.22)
Globulin: 2.4 g/dL (calc) (ref 1.9–3.7)
Glucose, Bld: 120 mg/dL (ref 65–139)
Potassium: 4.7 mmol/L (ref 3.5–5.3)
Sodium: 141 mmol/L (ref 135–146)
Total Bilirubin: 0.6 mg/dL (ref 0.2–1.2)
Total Protein: 6.8 g/dL (ref 6.1–8.1)
eGFR: 75 mL/min/{1.73_m2} (ref >=60)

## 2022-07-14 LAB — CBC
HCT: 43.4 % (ref 38.5–50.0)
Hemoglobin: 14.5 g/dL (ref 13.2–17.1)
MCH: 28.9 pg (ref 27.0–33.0)
MCHC: 33.4 g/dL (ref 32.0–36.0)
MCV: 86.5 fL (ref 80.0–100.0)
MPV: 10.6 fL (ref 7.5–12.5)
Platelets: 239 10*3/uL (ref 140–400)
RBC: 5.02 10*6/uL (ref 4.20–5.80)
RDW: 12.5 % (ref 11.0–15.0)
WBC: 5.6 10*3/uL (ref 3.8–10.8)

## 2022-07-14 LAB — HEMOGLOBIN A1C
Hgb A1c MFr Bld: 5.5 % of total Hgb (ref <5.7)
Mean Plasma Glucose: 111 mg/dL
eAG (mmol/L): 6.2 mmol/L

## 2022-07-14 LAB — TEST AUTHORIZATION: TEST CODE:: 7600

## 2022-07-14 LAB — PSA: PSA: 1.96 ng/mL (ref <=4.00)

## 2022-07-15 DIAGNOSIS — L570 Actinic keratosis: Secondary | ICD-10-CM | POA: Diagnosis not present

## 2022-07-28 DIAGNOSIS — M9902 Segmental and somatic dysfunction of thoracic region: Secondary | ICD-10-CM | POA: Diagnosis not present

## 2022-07-28 DIAGNOSIS — M62830 Muscle spasm of back: Secondary | ICD-10-CM | POA: Diagnosis not present

## 2022-07-28 DIAGNOSIS — M9903 Segmental and somatic dysfunction of lumbar region: Secondary | ICD-10-CM | POA: Diagnosis not present

## 2022-07-28 DIAGNOSIS — M5134 Other intervertebral disc degeneration, thoracic region: Secondary | ICD-10-CM | POA: Diagnosis not present

## 2022-08-11 DIAGNOSIS — M5134 Other intervertebral disc degeneration, thoracic region: Secondary | ICD-10-CM | POA: Diagnosis not present

## 2022-08-11 DIAGNOSIS — M62830 Muscle spasm of back: Secondary | ICD-10-CM | POA: Diagnosis not present

## 2022-08-11 DIAGNOSIS — M9903 Segmental and somatic dysfunction of lumbar region: Secondary | ICD-10-CM | POA: Diagnosis not present

## 2022-08-11 DIAGNOSIS — M9902 Segmental and somatic dysfunction of thoracic region: Secondary | ICD-10-CM | POA: Diagnosis not present

## 2022-08-12 ENCOUNTER — Ambulatory Visit: Payer: Medicare PPO | Admitting: Internal Medicine

## 2022-08-12 ENCOUNTER — Encounter: Payer: Self-pay | Admitting: Internal Medicine

## 2022-08-12 VITALS — BP 136/74 | HR 78 | Temp 97.1°F | Wt 163.0 lb

## 2022-08-12 DIAGNOSIS — R030 Elevated blood-pressure reading, without diagnosis of hypertension: Secondary | ICD-10-CM

## 2022-08-12 NOTE — Progress Notes (Signed)
Subjective:    Patient ID: Jacob Gray, male    DOB: [birth year], 88 y.o.   MRN: [id number]  HPI  Patient presents to clinic today for 1 month follow-up of HTN.  At his last visit, his BP was elevated.  He declined starting antihypertensive therapy at that time.  His BP today is 136/74.  His BP at home has been ranging 111/70 to 155/84.  Review of Systems     No past medical history on file.  Current Outpatient Medications  Medication Sig Dispense Refill   ASPIRIN 81 PO Take by mouth.     finasteride (PROSCAR) 5 MG tablet      Glucosamine HCl (GLUCOSAMINE PO) Take by mouth 2 (two) times daily.     Multiple Vitamin (MULTIVITAMIN) tablet Take 1 tablet by mouth daily.     omeprazole (PRILOSEC) 20 MG capsule TAKE 1 CAPSULE BY MOUTH EVERY DAY 90 capsule 3   No current facility-administered medications for this visit.    No Known Allergies  No family history on file.  Social History   Socioeconomic History   Marital status: Married    Spouse name: Not on file   Number of children: 2   Years of education: Not on file   Highest education level: Not on file  Occupational History   Occupation: Clinical biochemist  Tobacco Use   Smoking status: Never   Smokeless tobacco: Current    Types: Snuff  Vaping Use   Vaping Use: Never used  Substance and Sexual Activity   Alcohol use: Yes    Alcohol/week: 0.0 standard drinks of alcohol    Comment: rarely   Drug use: No   Sexual activity: Yes  Other Topics Concern   Not on file  Social History Narrative   Has a living will- would not desire prolonged life support if futile.   Social Determinants of Health   Financial Resource Strain: Not on file  Food Insecurity: Not on file  Transportation Needs: Not on file  Physical Activity: Not on file  Stress: Not on file  Social Connections: Not on file  Intimate Partner Violence: Not on file     Constitutional: Denies fever, malaise, fatigue, headache or abrupt weight  changes.  Respiratory: Denies difficulty breathing, shortness of breath, cough or sputum production.   Cardiovascular: Denies chest pain, chest tightness, palpitations or swelling in the hands or feet.  Neurological: Denies dizziness, difficulty with memory, difficulty with speech or problems with balance and coordination.    No other specific complaints in a complete review of systems (except as listed in HPI above).  Objective:   Physical Exam BP 136/74 (BP Location: Left Arm, Patient Position: Sitting, Cuff Size: Normal)   Pulse 78   Temp (!) 97.1 F (36.2 C) (Temporal)   Wt 163 lb (73.9 kg)   SpO2 100%   BMI 23.73 kg/m   Wt Readings from Last 3 Encounters:  07/13/22 162 lb (73.5 kg)  07/10/21 165 lb 12.8 oz (75.2 kg)  07/08/20 169 lb (76.7 kg)    General: Appears his stated age, well developed, well nourished in NAD. HEENT: Head: normal shape and size; Eyes: sclera white, no icterus, conjunctiva pink, PERRLA and EOMs intact;  Cardiovascular: Normal rate and rhythm. S1,S2 noted.  No murmur, rubs or gallops noted. No JVD or BLE edema Pulmonary/Chest: Normal effort and positive vesicular breath sounds. No respiratory distress. No wheezes, rales or ronchi noted.  Neurological: Alert and oriented.    BMET  Component Value Date/Time   NA 141 07/13/2022 1526   K 4.7 07/13/2022 1526   CL 104 07/13/2022 1526   CO2 27 07/13/2022 1526   GLUCOSE 120 07/13/2022 1526   BUN 16 07/13/2022 1526   CREATININE 0.98 07/13/2022 1526   CALCIUM 9.8 07/13/2022 1526    Lipid Panel     Component Value Date/Time   CHOL 175 07/13/2022 1526   TRIG 108 07/13/2022 1526   HDL 45 07/13/2022 1526   CHOLHDL 3.9 07/13/2022 1526   VLDL 17.0 07/08/2020 1546   LDLCALC 108 (H) 07/13/2022 1526    CBC    Component Value Date/Time   WBC 5.6 07/13/2022 1526   RBC 5.02 07/13/2022 1526   HGB 14.5 07/13/2022 1526   HCT 43.4 07/13/2022 1526   PLT 239 07/13/2022 1526   MCV 86.5 07/13/2022 1526    MCH 28.9 07/13/2022 1526   MCHC 33.4 07/13/2022 1526   RDW 12.5 07/13/2022 1526   LYMPHSABS 1,140 04/18/2019 1258   MONOABS 0.4 09/27/2016 1425   EOSABS 39 04/18/2019 1258   BASOSABS 31 04/18/2019 1258    Hgb A1C Lab Results  Component Value Date   HGBA1C 5.5 07/13/2022            Assessment & Plan:   Elevated Blood Pressure Reading in Office without Diagnosis of HTN:  BP normal today We will continue to monitor at this time  RTC in 5 months, follow-up chronic conditions Nicki Reaper, NP

## 2022-08-12 NOTE — Patient Instructions (Signed)
Hypertension, Adult ?Hypertension is another name for high blood pressure. High blood pressure forces your heart to work harder to pump blood. This can cause problems over time. ?There are two numbers in a blood pressure reading. There is a top number (systolic) over a bottom number (diastolic). It is best to have a blood pressure that is below 120/80. ?What are the causes? ?The cause of this condition is not known. Some other conditions can lead to high blood pressure. ?What increases the risk? ?Some lifestyle factors can make you more likely to develop high blood pressure: ?Smoking. ?Not getting enough exercise or physical activity. ?Being overweight. ?Having too much fat, sugar, calories, or salt (sodium) in your diet. ?Drinking too much alcohol. ?Other risk factors include: ?Having any of these conditions: ?Heart disease. ?Diabetes. ?High cholesterol. ?Kidney disease. ?Obstructive sleep apnea. ?Having a family history of high blood pressure and high cholesterol. ?Age. The risk increases with age. ?Stress. ?What are the signs or symptoms? ?High blood pressure may not cause symptoms. Very high blood pressure (hypertensive crisis) may cause: ?Headache. ?Fast or uneven heartbeats (palpitations). ?Shortness of breath. ?Nosebleed. ?Vomiting or feeling like you may vomit (nauseous). ?Changes in how you see. ?Very bad chest pain. ?Feeling dizzy. ?Seizures. ?How is this treated? ?This condition is treated by making healthy lifestyle changes, such as: ?Eating healthy foods. ?Exercising more. ?Drinking less alcohol. ?Your doctor may prescribe medicine if lifestyle changes do not help enough and if: ?Your top number is above 130. ?Your bottom number is above 80. ?Your personal target blood pressure may vary. ?Follow these instructions at home: ?Eating and drinking ? ?If told, follow the DASH eating plan. To follow this plan: ?Fill one half of your plate at each meal with fruits and vegetables. ?Fill one fourth of your plate  at each meal with whole grains. Whole grains include whole-wheat pasta, brown rice, and whole-grain bread. ?Eat or drink low-fat dairy products, such as skim milk or low-fat yogurt. ?Fill one fourth of your plate at each meal with low-fat (lean) proteins. Low-fat proteins include fish, chicken without skin, eggs, beans, and tofu. ?Avoid fatty meat, cured and processed meat, or chicken with skin. ?Avoid pre-made or processed food. ?Limit the amount of salt in your diet to less than 1,500 mg each day. ?Do not drink alcohol if: ?Your doctor tells you not to drink. ?You are pregnant, may be pregnant, or are planning to become pregnant. ?If you drink alcohol: ?Limit how much you have to: ?0-1 drink a day for women. ?0-2 drinks a day for men. ?Know how much alcohol is in your drink. In the [place] one drink equals one 12 oz bottle of beer (355 mL), one 5 oz glass of wine (148 mL), or one 1? oz glass of hard liquor (44 mL). ?Lifestyle ? ?Work with your doctor to stay at a healthy weight or to lose weight. Ask your doctor what the best weight is for you. ?Get at least 30 minutes of exercise that causes your heart to beat faster (aerobic exercise) most days of the week. This may include walking, swimming, or biking. ?Get at least 30 minutes of exercise that strengthens your muscles (resistance exercise) at least 3 days a week. This may include lifting weights or doing Pilates. ?Do not smoke or use any products that contain nicotine or tobacco. If you need help quitting, ask your doctor. ?Check your blood pressure at home as told by your doctor. ?Keep all follow-up visits. ?Medicines ?Take over-the-counter and prescription medicines   only as told by your doctor. Follow directions carefully. ?Do not skip doses of blood pressure medicine. The medicine does not work as well if you skip doses. Skipping doses also puts you at risk for problems. ?Ask your doctor about side effects or reactions to medicines that you should watch  for. ?Contact a doctor if: ?You think you are having a reaction to the medicine you are taking. ?You have headaches that keep coming back. ?You feel dizzy. ?You have swelling in your ankles. ?You have trouble with your vision. ?Get help right away if: ?You get a very bad headache. ?You start to feel mixed up (confused). ?You feel weak or numb. ?You feel faint. ?You have very bad pain in your: ?Chest. ?Belly (abdomen). ?You vomit more than once. ?You have trouble breathing. ?These symptoms may be an emergency. Get help right away. Call 911. ?Do not wait to see if the symptoms will go away. ?Do not drive yourself to the hospital. ?Summary ?Hypertension is another name for high blood pressure. ?High blood pressure forces your heart to work harder to pump blood. ?For most people, a normal blood pressure is less than 120/80. ?Making healthy choices can help lower blood pressure. If your blood pressure does not get lower with healthy choices, you may need to take medicine. ?This information is not intended to replace advice given to you by your health care provider. Make sure you discuss any questions you have with your health care provider. ?Document Revised: 07/23/2021 Document Reviewed: 07/23/2021 ?Elsevier Patient Education ? 2023 Elsevier Inc. ? ?

## 2022-08-25 DIAGNOSIS — M5134 Other intervertebral disc degeneration, thoracic region: Secondary | ICD-10-CM | POA: Diagnosis not present

## 2022-08-25 DIAGNOSIS — M9903 Segmental and somatic dysfunction of lumbar region: Secondary | ICD-10-CM | POA: Diagnosis not present

## 2022-08-25 DIAGNOSIS — M62830 Muscle spasm of back: Secondary | ICD-10-CM | POA: Diagnosis not present

## 2022-08-25 DIAGNOSIS — M9902 Segmental and somatic dysfunction of thoracic region: Secondary | ICD-10-CM | POA: Diagnosis not present

## 2022-08-27 ENCOUNTER — Other Ambulatory Visit: Payer: Self-pay | Admitting: Internal Medicine

## 2022-08-27 NOTE — Telephone Encounter (Signed)
Requested Prescriptions  Pending Prescriptions Disp Refills   omeprazole (PRILOSEC) 20 MG capsule [Pharmacy Med Name: OMEPRAZOLE DR 20 MG CAPSULE] 90 capsule 3    Sig: TAKE 1 CAPSULE BY MOUTH EVERY DAY     Gastroenterology: Proton Pump Inhibitors Passed - 08/27/2022  2:28 AM      Passed - Valid encounter within last 12 months    Recent Outpatient Visits           2 weeks ago Elevated blood pressure reading in office without diagnosis of hypertension   [facility] [place], Salvadore Oxford, NP   1 month ago Encounter for general adult medical examination with abnormal findings   [facility] [place], Salvadore Oxford, NP   1 year ago Medicare annual wellness visit, subsequent   [facility] [place], Salvadore Oxford, NP       Future Appointments             In 4 months Baity, Salvadore Oxford, NP [facility], [facility]

## 2022-09-08 DIAGNOSIS — M62830 Muscle spasm of back: Secondary | ICD-10-CM | POA: Diagnosis not present

## 2022-09-08 DIAGNOSIS — M9903 Segmental and somatic dysfunction of lumbar region: Secondary | ICD-10-CM | POA: Diagnosis not present

## 2022-09-08 DIAGNOSIS — M5134 Other intervertebral disc degeneration, thoracic region: Secondary | ICD-10-CM | POA: Diagnosis not present

## 2022-09-08 DIAGNOSIS — M9902 Segmental and somatic dysfunction of thoracic region: Secondary | ICD-10-CM | POA: Diagnosis not present

## 2022-09-14 DIAGNOSIS — L578 Other skin changes due to chronic exposure to nonionizing radiation: Secondary | ICD-10-CM | POA: Diagnosis not present

## 2022-09-14 DIAGNOSIS — L570 Actinic keratosis: Secondary | ICD-10-CM | POA: Diagnosis not present

## 2022-09-14 DIAGNOSIS — C44319 Basal cell carcinoma of skin of other parts of face: Secondary | ICD-10-CM | POA: Diagnosis not present

## 2022-09-14 DIAGNOSIS — D492 Neoplasm of unspecified behavior of bone, soft tissue, and skin: Secondary | ICD-10-CM | POA: Diagnosis not present

## 2022-09-21 ENCOUNTER — Ambulatory Visit (INDEPENDENT_AMBULATORY_CARE_PROVIDER_SITE_OTHER): Payer: Medicare PPO

## 2022-09-21 DIAGNOSIS — Z0000 Encounter for general adult medical examination without abnormal findings: Secondary | ICD-10-CM | POA: Diagnosis not present

## 2022-09-21 NOTE — Progress Notes (Signed)
I connected with  Jacob Gray on 09/21/22 by a audio enabled telemedicine application and verified that I am speaking with the correct person using two identifiers.  Patient Location: Home  Provider Location: Office/Clinic  I discussed the limitations of evaluation and management by telemedicine. The patient expressed understanding and agreed to proceed.   Subjective:   Jacob Gray is a 88 y.o. male who presents for Medicare Annual/Subsequent preventive examination.  Review of Systems    Per HPI unless specifically indicated below.  Cardiac Risk Factors include: advanced age (>1men, >56 women);male gender,and hypertriglyceridemia.           Objective:       08/12/2022    2:43 PM 07/13/2022    3:13 PM 07/13/2022    3:07 PM  Vitals with BMI  Height   5' 9.5"  Weight 163 lbs  162 lbs  BMI 23.73  23.59  Systolic 136 168 371  Diastolic 74 78 86  Pulse 78  71    There were no vitals filed for this visit. There is no height or weight on file to calculate BMI.     09/27/2016    2:04 PM  Advanced Directives  Does Patient Have a Medical Advance Directive? Yes  Type of Estate agent of [place];Living will  Copy of Healthcare Power of Attorney in Chart? No - copy requested    Current Medications (verified) Outpatient Encounter Medications as of 09/21/2022  Medication Sig   ASPIRIN 81 PO Take by mouth.   finasteride (PROSCAR) 5 MG tablet    Glucosamine HCl (GLUCOSAMINE PO) Take by mouth 2 (two) times daily.   Multiple Vitamin (MULTIVITAMIN) tablet Take 1 tablet by mouth daily.   omeprazole (PRILOSEC) 20 MG capsule TAKE 1 CAPSULE BY MOUTH EVERY DAY   No facility-administered encounter medications on file as of 09/21/2022.    Allergies (verified) Patient has no known allergies.   History: No past medical history on file. Past Surgical History:  Procedure Laterality Date   PROSTATE BIOPSY     No family history on file. Social  History   Socioeconomic History   Marital status: Married    Spouse name: Jacob Gray   Number of children: 1   Years of education: Not on file   Highest education level: Not on file  Occupational History   Occupation: Product/process development scientist   Occupation: Retired  Tobacco Use   Smoking status: Never   Smokeless tobacco: Current    Types: Snuff  Vaping Use   Vaping Use: Never used  Substance and Sexual Activity   Alcohol use: Yes    Alcohol/week: 0.0 standard drinks of alcohol    Comment: rarely   Drug use: No   Sexual activity: Yes  Other Topics Concern   Not on file  Social History Narrative   Has a living will- would not desire prolonged life support if futile.   Social Determinants of Health   Financial Resource Strain: Low Risk  (09/21/2022)   Overall Financial Resource Strain (CARDIA)    Difficulty of Paying Living Expenses: Not hard at all  Food Insecurity: No Food Insecurity (09/21/2022)   Hunger Vital Sign    Worried About Running Out of Food in the Last Year: Never true    Ran Out of Food in the Last Year: Never true  Transportation Needs: No Transportation Needs (09/21/2022)   PRAPARE - Transportation    Lack of Transportation (Medical): No    Lack of  Transportation (Non-Medical): No  Physical Activity: Sufficiently Active (09/21/2022)   Exercise Vital Sign    Days of Exercise per Week: 7 days    Minutes of Exercise per Session: 60 min  Stress: No Stress Concern Present (09/21/2022)   [employer] of Occupational Health - Occupational Stress Questionnaire    Feeling of Stress : Not at all  Social Connections: Socially Integrated (09/21/2022)   Social Connection and Isolation Panel [NHANES]    Frequency of Communication with Friends and Family: More than three times a week    Frequency of Social Gatherings with Friends and Family: More than three times a week    Attends Religious Services: More than 4 times per year    Active Member of [employer] or  Organizations: Yes    Attends Engineer, structural: More than 4 times per year    Marital Status: Married    Tobacco Counseling Ready to quit: Not Answered Counseling given: Not Answered   Clinical Intake:  Pre-visit preparation completed: No  Pain : No/denies pain     Nutritional Status: BMI 25 -29 Overweight Nutritional Risks: None Diabetes: No  How often do you need to have someone help you when you read instructions, pamphlets, or other written materials from your doctor or pharmacy?: 1 - Never  Diabetic?No   Interpreter Needed?: No  Information entered by :: Jacob Gray, CMA   Activities of Daily Living    09/21/2022    2:51 PM  In your present state of health, do you have any difficulty performing the following activities:  Hearing? 1  Vision? 0  Comment [facility]  Difficulty concentrating or making decisions? 1  Walking or climbing stairs? 0  Dressing or bathing? 0  Doing errands, shopping? 0    Patient Care Team: Lorre Munroe, NP as PCP - General (Internal Medicine) Jerilee Field, MD as Consulting Physician (Urology) Cherlyn Roberts, MD as Consulting Physician (Dermatology) Luane School, OD as Consulting Physician (Optometry) Jonn Shingles, MD as Referring Physician (Orthopedic Surgery)  Indicate any recent Medical Services you may have received from other than [facility] providers in the past year (date may be approximate). No hospitalization in past 12 months.    Assessment:   This is a routine wellness examination for Jacob Gray.  Hearing/Vision screen Hearing loss. Wear bilateral hearing aids. Annual Eye Exam [facility], Wear glasses, no vision changes.  Dietary issues and exercise activities discussed: Current Exercise Habits: Structured exercise class, Type of exercise: Other - see comments (golf), Time (Minutes): 60, Frequency (Times/Week): 4, Weekly Exercise (Minutes/Week): 240, Intensity:  Moderate, Exercise limited by: None identified   Goals Addressed             This Visit's Progress    Stay Active and Independent           Why is this important?   Regular activity or exercise is important to managing back pain.  Activity helps to keep your muscles strong.  You will sleep better and feel more relaxed.  You will have more energy and feel less stressed.  If you are not active now, start slowly. Little changes make a big difference.  Rest, but not too much.  Stay as active as you can and listen to your body's signals.           Depression Screen    09/21/2022    2:51 PM 07/13/2022    3:26 PM 07/10/2021    1:40 PM 07/08/2020  3:25 PM 11/16/2019    3:18 PM 04/18/2019    1:16 PM 11/07/2017    9:19 AM  PHQ 2/9 Scores  PHQ - 2 Score 0 0 0 0 0 0 0  PHQ- 9 Score   0        Fall Risk    09/21/2022    2:51 PM 07/10/2021    1:43 PM 07/08/2020    3:25 PM 04/18/2019    1:15 PM 11/07/2017    9:19 AM  Fall Risk   Falls in the past year? 0 0 0 0 No  Number falls in past yr: 0 0     Injury with Fall? 0 0     Risk for fall due to : No Fall Risks No Fall Risks     Follow up Falls evaluation completed Falls evaluation completed       FALL RISK PREVENTION PERTAINING TO THE HOME:  Any stairs in or around the home? Yes  If so, are there any without handrails? No  Home free of loose throw rugs in walkways, pet beds, electrical cords, etc? Yes  Adequate lighting in your home to reduce risk of falls? Yes   ASSISTIVE DEVICES UTILIZED TO PREVENT FALLS:  Life alert? No  Use of a cane, walker or w/c? No  Grab bars in the bathroom? Yes  Shower chair or bench in shower? Yes  Elevated toilet seat or a handicapped toilet? No   TIMED UP AND GO:  Was the test performed?  virtual, telephone visit  .    Cognitive Function:    09/27/2016    2:25 PM 09/27/2016    2:13 PM  MMSE - Mini Mental State Exam  Orientation to time  5  Orientation to Place  5  Registration  3   Attention/ Calculation  0  Recall  2  Recall-comments pt was unable to recall 1 of 3 words   Language- name 2 objects  0  Language- repeat  1  Language- follow 3 step command  3  Language- read & follow direction  0  Write a sentence  0  Copy design  0  Total score  19        09/21/2022    2:52 PM  6CIT Screen  What Year? 0 points  What month? 0 points  What time? 0 points  Count back from 20 0 points  Months in reverse 0 points  Repeat phrase 0 points  Total Score 0 points    Immunizations Immunization History  Administered Date(s) Administered   Fluad Quad(high Dose 65+) 06/27/2019, 07/08/2020, 07/10/2021, 07/13/2022   Influenza Whole 08/18/2007, 07/23/2008, 09/08/2010   Influenza, Seasonal, Injecte, Preservative Fre 07/13/2016   Influenza,inj,Quad PF,6+ Mos 07/27/2018   Influenza-Unspecified 09/02/2014, 07/03/2015   Moderna Sars-Covid-2 Vaccination 10/29/2019, 11/24/2019, 04/16/2021   Pneumococcal Conjugate-13 10/07/2015   Pneumococcal Polysaccharide-23 07/22/2009   Td 07/22/2009   Tdap 07/08/2016   Zoster Recombinat (Shingrix) 08/08/2019, 10/11/2019    TDAP status: Up to date  Flu Vaccine status: Up to date  Pneumococcal vaccine status: Up to date  Covid-19 vaccine status: Information provided on how to obtain vaccines.   Qualifies for Shingles Vaccine? Yes   Zostavax completed Yes   Shingrix Completed?: Yes  Screening Tests Health Maintenance  Topic Date Due   COVID-19 Vaccine (4 - 2023-24 season) 06/18/2022   Medicare Annual Wellness (AWV)  09/22/2023   DTaP/Tdap/Td (3 - Td or Tdap) 07/08/2026   Pneumonia Vaccine 61+ Years old  Completed   INFLUENZA VACCINE  Completed   Zoster Vaccines- Shingrix  Completed   HPV VACCINES  Aged Out    Health Maintenance  Health Maintenance Due  Topic Date Due   COVID-19 Vaccine (4 - 2023-24 season) 06/18/2022    Colorectal cancer screening: No longer required.   Lung Cancer Screening: (Low Dose CT Chest  recommended if Age 57-80 years, 30 pack-year currently smoking OR have quit w/in 15years.) does not qualify.   Lung Cancer Screening Referral: not applicable   Additional Screening:  Hepatitis C Screening: does not qualify  Vision Screening: Recommended annual ophthalmology exams for early detection of glaucoma and other disorders of the eye. Is the patient up to date with their annual eye exam?  Yes  Who is the provider or what is the name of the office in which the patient attends annual eye exams? [facility]  If pt is not established with a provider, would they like to be referred to a provider to establish care? No .   Dental Screening: Recommended annual dental exams for proper oral hygiene  Community Resource Referral / Chronic Care Management: CRR required this visit?  No   CCM required this visit?  No      Plan:     I have personally reviewed and noted the following in the patient's chart:   Medical and social history Use of alcohol, tobacco or illicit drugs  Current medications and supplements including opioid prescriptions. Patient is not currently taking opioid prescriptions. Functional ability and status Nutritional status Physical activity Advanced directives List of other physicians Hospitalizations, surgeries, and ER visits in previous 12 months Vitals Screenings to include cognitive, depression, and falls Referrals and appointments  In addition, I have reviewed and discussed with patient certain preventive protocols, quality metrics, and best practice recommendations. A written personalized care plan for preventive services as well as general preventive health recommendations were provided to patient.    Mr. Dedman , Thank you for taking time to come for your Medicare Wellness Visit. I appreciate your ongoing commitment to your health goals. Please review the following plan we discussed and let me know if I can assist you in the future.    These are the goals we discussed:  Goals      Increase physical activity     Starting 09/27/2016, I will continue to exercise at least 2 hours daily as weather and schedule permits.      Stay Active and Independent         Why is this important?   Regular activity or exercise is important to managing back pain.  Activity helps to keep your muscles strong.  You will sleep better and feel more relaxed.  You will have more energy and feel less stressed.  If you are not active now, start slowly. Little changes make a big difference.  Rest, but not too much.  Stay as active as you can and listen to your body's signals.            This is a list of the screening recommended for you and due dates:  Health Maintenance  Topic Date Due   COVID-19 Vaccine (4 - 2023-24 season) 06/18/2022   Medicare Annual Wellness Visit  09/22/2023   DTaP/Tdap/Td vaccine (3 - Td or Tdap) 07/08/2026   Pneumonia Vaccine  Completed   Flu Shot  Completed   Zoster (Shingles) Vaccine  Completed   HPV Vaccine  Aged Out  Lonna CobbKelita L Teofila Bowery, CMA   09/21/2022   Nurse Notes: Approximately 30 minute Non-Face -To-Face Medicare Wellness Visit

## 2022-09-21 NOTE — Patient Instructions (Signed)
Health Maintenance, Male Adopting a healthy lifestyle and getting preventive care are important in promoting health and wellness. Ask your health care provider about: The right schedule for you to have regular tests and exams. Things you can do on your own to prevent diseases and keep yourself healthy. What should I know about diet, weight, and exercise? Eat a healthy diet  Eat a diet that includes plenty of vegetables, fruits, low-fat dairy products, and lean protein. Do not eat a lot of foods that are high in solid fats, added sugars, or sodium. Maintain a healthy weight Body mass index (BMI) is a measurement that can be used to identify possible weight problems. It estimates body fat based on height and weight. Your health care provider can help determine your BMI and help you achieve or maintain a healthy weight. Get regular exercise Get regular exercise. This is one of the most important things you can do for your health. Most adults should: Exercise for at least 150 minutes each week. The exercise should increase your heart rate and make you sweat (moderate-intensity exercise). Do strengthening exercises at least twice a week. This is in addition to the moderate-intensity exercise. Spend less time sitting. Even light physical activity can be beneficial. Watch cholesterol and blood lipids Have your blood tested for lipids and cholesterol at 86 years of age, then have this test every 5 years. You may need to have your cholesterol levels checked more often if: Your lipid or cholesterol levels are high. You are older than 86 years of age. You are at high risk for heart disease. What should I know about cancer screening? Many types of cancers can be detected early and may often be prevented. Depending on your health history and family history, you may need to have cancer screening at various ages. This may include screening for: Colorectal cancer. Prostate cancer. Skin cancer. Lung  cancer. What should I know about heart disease, diabetes, and high blood pressure? Blood pressure and heart disease High blood pressure causes heart disease and increases the risk of stroke. This is more likely to develop in people who have high blood pressure readings or are overweight. Talk with your health care provider about your target blood pressure readings. Have your blood pressure checked: Every 3-5 years if you are 18-39 years of age. Every year if you are 40 years old or older. If you are between the ages of 65 and 75 and are a current or former smoker, ask your health care provider if you should have a one-time screening for abdominal aortic aneurysm (AAA). Diabetes Have regular diabetes screenings. This checks your fasting blood sugar level. Have the screening done: Once every three years after age 45 if you are at a normal weight and have a low risk for diabetes. More often and at a younger age if you are overweight or have a high risk for diabetes. What should I know about preventing infection? Hepatitis B If you have a higher risk for hepatitis B, you should be screened for this virus. Talk with your health care provider to find out if you are at risk for hepatitis B infection. Hepatitis C Blood testing is recommended for: Everyone born from 1945 through 1965. Anyone with known risk factors for hepatitis C. Sexually transmitted infections (STIs) You should be screened each year for STIs, including gonorrhea and chlamydia, if: You are sexually active and are younger than 86 years of age. You are older than 86 years of age and your   health care provider tells you that you are at risk for this type of infection. Your sexual activity has changed since you were last screened, and you are at increased risk for chlamydia or gonorrhea. Ask your health care provider if you are at risk. Ask your health care provider about whether you are at high risk for HIV. Your health care provider  may recommend a prescription medicine to help prevent HIV infection. If you choose to take medicine to prevent HIV, you should first get tested for HIV. You should then be tested every 3 months for as long as you are taking the medicine. Follow these instructions at home: Alcohol use Do not drink alcohol if your health care provider tells you not to drink. If you drink alcohol: Limit how much you have to 0-2 drinks a day. Know how much alcohol is in your drink. In the [place] one drink equals one 12 oz bottle of beer (355 mL), one 5 oz glass of wine (148 mL), or one 1 oz glass of hard liquor (44 mL). Lifestyle Do not use any products that contain nicotine or tobacco. These products include cigarettes, chewing tobacco, and vaping devices, such as e-cigarettes. If you need help quitting, ask your health care provider. Do not use street drugs. Do not share needles. Ask your health care provider for help if you need support or information about quitting drugs. General instructions Schedule regular health, dental, and eye exams. Stay current with your vaccines. Tell your health care provider if: You often feel depressed. You have ever been abused or do not feel safe at home. Summary Adopting a healthy lifestyle and getting preventive care are important in promoting health and wellness. Follow your health care provider's instructions about healthy diet, exercising, and getting tested or screened for diseases. Follow your health care provider's instructions on monitoring your cholesterol and blood pressure. This information is not intended to replace advice given to you by your health care provider. Make sure you discuss any questions you have with your health care provider. Document Revised: 02/23/2021 Document Reviewed: 02/23/2021 Elsevier Patient Education  2023 Elsevier Inc.  

## 2022-09-22 DIAGNOSIS — M9902 Segmental and somatic dysfunction of thoracic region: Secondary | ICD-10-CM | POA: Diagnosis not present

## 2022-09-22 DIAGNOSIS — M9903 Segmental and somatic dysfunction of lumbar region: Secondary | ICD-10-CM | POA: Diagnosis not present

## 2022-09-22 DIAGNOSIS — M5134 Other intervertebral disc degeneration, thoracic region: Secondary | ICD-10-CM | POA: Diagnosis not present

## 2022-09-22 DIAGNOSIS — M62830 Muscle spasm of back: Secondary | ICD-10-CM | POA: Diagnosis not present

## 2022-09-28 ENCOUNTER — Telehealth: Payer: Self-pay | Admitting: *Deleted

## 2022-09-28 NOTE — Patient Outreach (Signed)
  Care Coordination   09/28/2022 Name: Jacob Gray MRN: [id number] DOB: 1934-03-30   Care Coordination Outreach Attempts:  An unsuccessful telephone outreach was attempted today to offer the patient information about available care coordination services as a benefit of their health plan.   Follow Up Plan:  Additional outreach attempts will be made to offer the patient care coordination information and services.   Encounter Outcome:  No Answer   Care Coordination Interventions:  No, not indicated    Kemper Durie, RN, MSN, [facility] Care Management Care Management Coordinator [phone]

## 2022-10-04 ENCOUNTER — Telehealth: Payer: Self-pay | Admitting: *Deleted

## 2022-10-04 NOTE — Patient Outreach (Signed)
  Care Coordination   10/04/2022 Name: Jacob Gray MRN: [id number] DOB: [birth year]   Care Coordination Outreach Attempts:  A second unsuccessful outreach was attempted today to offer the patient with information about available care coordination services as a benefit of their health plan.     Follow Up Plan:  Additional outreach attempts will be made to offer the patient care coordination information and services.   Encounter Outcome:  No Answer   Care Coordination Interventions:  No, not indicated    Kemper Durie, RN, MSN, [facility] Care Management Care Management Coordinator [phone]

## 2022-10-06 DIAGNOSIS — M9903 Segmental and somatic dysfunction of lumbar region: Secondary | ICD-10-CM | POA: Diagnosis not present

## 2022-10-06 DIAGNOSIS — M62830 Muscle spasm of back: Secondary | ICD-10-CM | POA: Diagnosis not present

## 2022-10-06 DIAGNOSIS — M5134 Other intervertebral disc degeneration, thoracic region: Secondary | ICD-10-CM | POA: Diagnosis not present

## 2022-10-06 DIAGNOSIS — M9902 Segmental and somatic dysfunction of thoracic region: Secondary | ICD-10-CM | POA: Diagnosis not present

## 2022-10-07 ENCOUNTER — Telehealth: Payer: Self-pay | Admitting: *Deleted

## 2022-10-07 NOTE — Patient Outreach (Signed)
  Care Coordination   10/07/2022 Name: Jacob Gray MRN: [id number] DOB: Feb 28, 1934   Care Coordination Outreach Attempts:  A third unsuccessful outreach was attempted today to offer the patient with information about available care coordination services as a benefit of their health plan.   Follow Up Plan:  No further outreach attempts will be made at this time. We have been unable to contact the patient to offer or enroll patient in care coordination services  Encounter Outcome:  No Answer   Care Coordination Interventions:  No, not indicated    Kemper Durie, RN, MSN, [facility] Care Management Care Management Coordinator [phone]

## 2022-10-20 DIAGNOSIS — M9902 Segmental and somatic dysfunction of thoracic region: Secondary | ICD-10-CM | POA: Diagnosis not present

## 2022-10-20 DIAGNOSIS — M9903 Segmental and somatic dysfunction of lumbar region: Secondary | ICD-10-CM | POA: Diagnosis not present

## 2022-10-20 DIAGNOSIS — M62830 Muscle spasm of back: Secondary | ICD-10-CM | POA: Diagnosis not present

## 2022-10-20 DIAGNOSIS — M5134 Other intervertebral disc degeneration, thoracic region: Secondary | ICD-10-CM | POA: Diagnosis not present

## 2022-11-03 DIAGNOSIS — M9903 Segmental and somatic dysfunction of lumbar region: Secondary | ICD-10-CM | POA: Diagnosis not present

## 2022-11-03 DIAGNOSIS — M62830 Muscle spasm of back: Secondary | ICD-10-CM | POA: Diagnosis not present

## 2022-11-03 DIAGNOSIS — M5134 Other intervertebral disc degeneration, thoracic region: Secondary | ICD-10-CM | POA: Diagnosis not present

## 2022-11-03 DIAGNOSIS — M9902 Segmental and somatic dysfunction of thoracic region: Secondary | ICD-10-CM | POA: Diagnosis not present

## 2022-12-16 DIAGNOSIS — L570 Actinic keratosis: Secondary | ICD-10-CM | POA: Diagnosis not present

## 2022-12-16 DIAGNOSIS — L821 Other seborrheic keratosis: Secondary | ICD-10-CM | POA: Diagnosis not present

## 2022-12-16 DIAGNOSIS — L814 Other melanin hyperpigmentation: Secondary | ICD-10-CM | POA: Diagnosis not present

## 2022-12-16 DIAGNOSIS — Z08 Encounter for follow-up examination after completed treatment for malignant neoplasm: Secondary | ICD-10-CM | POA: Diagnosis not present

## 2022-12-16 DIAGNOSIS — Z85828 Personal history of other malignant neoplasm of skin: Secondary | ICD-10-CM | POA: Diagnosis not present

## 2022-12-16 DIAGNOSIS — D225 Melanocytic nevi of trunk: Secondary | ICD-10-CM | POA: Diagnosis not present

## 2022-12-27 DIAGNOSIS — H2513 Age-related nuclear cataract, bilateral: Secondary | ICD-10-CM | POA: Diagnosis not present

## 2022-12-27 DIAGNOSIS — H02115 Cicatricial ectropion of left lower eyelid: Secondary | ICD-10-CM | POA: Diagnosis not present

## 2022-12-27 DIAGNOSIS — H02112 Cicatricial ectropion of right lower eyelid: Secondary | ICD-10-CM | POA: Diagnosis not present

## 2023-01-11 ENCOUNTER — Encounter: Payer: Self-pay | Admitting: Internal Medicine

## 2023-01-11 ENCOUNTER — Ambulatory Visit: Payer: Medicare PPO | Admitting: Internal Medicine

## 2023-01-11 VITALS — BP 166/88 | HR 67 | Temp 97.3°F | Wt 168.0 lb

## 2023-01-11 DIAGNOSIS — M159 Polyosteoarthritis, unspecified: Secondary | ICD-10-CM

## 2023-01-11 DIAGNOSIS — R351 Nocturia: Secondary | ICD-10-CM | POA: Diagnosis not present

## 2023-01-11 DIAGNOSIS — R7309 Other abnormal glucose: Secondary | ICD-10-CM

## 2023-01-11 DIAGNOSIS — E782 Mixed hyperlipidemia: Secondary | ICD-10-CM

## 2023-01-11 DIAGNOSIS — E781 Pure hyperglyceridemia: Secondary | ICD-10-CM | POA: Diagnosis not present

## 2023-01-11 DIAGNOSIS — K219 Gastro-esophageal reflux disease without esophagitis: Secondary | ICD-10-CM

## 2023-01-11 DIAGNOSIS — N401 Enlarged prostate with lower urinary tract symptoms: Secondary | ICD-10-CM

## 2023-01-11 DIAGNOSIS — I10 Essential (primary) hypertension: Secondary | ICD-10-CM | POA: Diagnosis not present

## 2023-01-11 DIAGNOSIS — R739 Hyperglycemia, unspecified: Secondary | ICD-10-CM

## 2023-01-11 MED ORDER — LOSARTAN POTASSIUM 25 MG PO TABS: 25.0000 mg | ORAL_TABLET | Freq: Every day | ORAL | 0 refills | Status: DC

## 2023-01-11 NOTE — Progress Notes (Unsigned)
Subjective:    Patient ID: Jacob Gray, male    DOB: 1934/07/28, 89 y.o.   MRN: [id number]  HPI  Patient presents to clinic today for 41-month follow-up of chronic conditions.  HLD: His last LDL was 108, triglycerides 108, 06/2022.  He is not taking cholesterol-lowering medication at this time.  He does not consume low-fat diet.  GERD: He is not sure what triggers this.  He has occasional breakthrough on omeprazole for which she takes Tums as needed with good relief of symptoms.  There is no upper GI on file.  OA: Generalized.  He is taking Glucosamine OTC.  He sees a Restaurant manager, fast food regularly.  BPH: Mainly nocturia.  Managed on Finasteride.  He does not follow with urology.  HTN: His BP today is 168/82. He is not taking any antihypertensive medications at this time. There is no ECG on file.  Review of Systems  No past medical history on file.  Current Outpatient Medications  Medication Sig Dispense Refill   ASPIRIN 81 PO Take by mouth.     finasteride (PROSCAR) 5 MG tablet      Glucosamine HCl (GLUCOSAMINE PO) Take by mouth 2 (two) times daily.     Multiple Vitamin (MULTIVITAMIN) tablet Take 1 tablet by mouth daily.     omeprazole (PRILOSEC) 20 MG capsule TAKE 1 CAPSULE BY MOUTH EVERY DAY 90 capsule 3   No current facility-administered medications for this visit.    No Known Allergies  No family history on file.  Social History   Socioeconomic History   Marital status: Married    Spouse name: Hymie Lojek   Number of children: 1   Years of education: Not on file   Highest education level: Associate degree: academic program  Occupational History   Occupation: Clinical biochemist   Occupation: Retired  Tobacco Use   Smoking status: Never   Smokeless tobacco: Current    Types: Snuff  Vaping Use   Vaping Use: Never used  Substance and Sexual Activity   Alcohol use: Yes    Alcohol/week: 0.0 standard drinks of alcohol    Comment: rarely   Drug use: No    Sexual activity: Yes  Other Topics Concern   Not on file  Social History Narrative   Has a living will- would not desire prolonged life support if futile.   Social Determinants of Health   Financial Resource Strain: Low Risk  (01/07/2023)   Overall Financial Resource Strain (CARDIA)    Difficulty of Paying Living Expenses: Not hard at all  Food Insecurity: No Food Insecurity (01/07/2023)   Hunger Vital Sign    Worried About Running Out of Food in the Last Year: Never true    Ran Out of Food in the Last Year: Never true  Transportation Needs: No Transportation Needs (01/07/2023)   PRAPARE - Hydrologist (Medical): No    Lack of Transportation (Non-Medical): No  Physical Activity: Sufficiently Active (01/07/2023)   Exercise Vital Sign    Days of Exercise per Week: 7 days    Minutes of Exercise per Session: 150+ min  Stress: No Stress Concern Present (01/07/2023)   [place]    Feeling of Stress : Not at all  Social Connections: [place] (01/07/2023)   Social Connection and Isolation Panel [NHANES]    Frequency of Communication with Friends and Family: More than three times a week    Frequency of Social Gatherings  with Friends and Family: More than three times a week    Attends Religious Services: More than 4 times per year    Active Member of Clubs or Organizations: Yes    Attends Music therapist: More than 4 times per year    Marital Status: Married  Human resources officer Violence: Not At Risk (09/21/2022)   Humiliation, Afraid, Rape, and Kick questionnaire    Fear of Current or Ex-Partner: No    Emotionally Abused: No    Physically Abused: No    Sexually Abused: No     Constitutional: Denies fever, malaise, fatigue, headache or abrupt weight changes.  HEENT: Denies eye pain, eye redness, ear pain, ringing in the ears, wax buildup, runny nose, nasal congestion, bloody  nose, or sore throat. Respiratory: Denies difficulty breathing, shortness of breath, cough or sputum production.   Cardiovascular: Denies chest pain, chest tightness, palpitations or swelling in the hands or feet.  Gastrointestinal: Denies abdominal pain, bloating, constipation, diarrhea or blood in the stool.  GU: Patient reports nocturia.  Denies urgency, frequency, pain with urination, burning sensation, blood in urine, odor or discharge. Musculoskeletal: Patient reports joint pain.  Denies decrease in range of motion, difficulty with gait, muscle pain or joint swelling.  Skin: Denies redness, rashes, lesions or ulcercations.  Neurological: Denies dizziness, difficulty with memory, difficulty with speech or problems with balance and coordination.  Psych: Denies anxiety, depression, SI/HI.  No other specific complaints in a complete review of systems (except as listed in HPI above).     Objective:   Physical Exam   BP (!) 166/88 (BP Location: Left Arm, Patient Position: Sitting, Cuff Size: Normal)   Pulse 67   Temp (!) 97.3 F (36.3 C) (Temporal)   Wt 168 lb (76.2 kg)   SpO2 99%   BMI 24.45 kg/m   Wt Readings from Last 3 Encounters:  08/12/22 163 lb (73.9 kg)  07/13/22 162 lb (73.5 kg)  07/10/21 165 lb 12.8 oz (75.2 kg)    General: Appears his stated age, well developed, well nourished in NAD. Skin: Warm, dry and intact.  HEENT: Head: normal shape and size; Eyes: sclera white, no icterus, conjunctiva pink, PERRLA and EOMs intact;  Cardiovascular: Normal rate and rhythm. S1,S2 noted.  No murmur, rubs or gallops noted. No JVD or BLE edema. No carotid bruits noted. Pulmonary/Chest: Normal effort and positive vesicular breath sounds. No respiratory distress. No wheezes, rales or ronchi noted.  Abdomen: Soft and nontender. Normal bowel sounds.  Musculoskeletal:  No difficulty with gait.  Neurological: Alert and oriented. Coordination normal.    BMET    Component Value  Date/Time   NA 141 07/13/2022 1526   K 4.7 07/13/2022 1526   CL 104 07/13/2022 1526   CO2 27 07/13/2022 1526   GLUCOSE 120 07/13/2022 1526   BUN 16 07/13/2022 1526   CREATININE 0.98 07/13/2022 1526   CALCIUM 9.8 07/13/2022 1526    Lipid Panel     Component Value Date/Time   CHOL 175 07/13/2022 1526   TRIG 108 07/13/2022 1526   HDL 45 07/13/2022 1526   CHOLHDL 3.9 07/13/2022 1526   VLDL 17.0 07/08/2020 1546   LDLCALC 108 (H) 07/13/2022 1526    CBC    Component Value Date/Time   WBC 5.6 07/13/2022 1526   RBC 5.02 07/13/2022 1526   HGB 14.5 07/13/2022 1526   HCT 43.4 07/13/2022 1526   PLT 239 07/13/2022 1526   MCV 86.5 07/13/2022 1526   MCH 28.9  07/13/2022 1526   MCHC 33.4 07/13/2022 1526   RDW 12.5 07/13/2022 1526   LYMPHSABS 1,140 04/18/2019 1258   MONOABS 0.4 09/27/2016 1425   EOSABS 39 04/18/2019 1258   BASOSABS 31 04/18/2019 1258    Hgb A1C Lab Results  Component Value Date   HGBA1C 5.5 07/13/2022           Assessment & Plan:   RTC in 2 weeks follow up HTN, 6 months for annual exam Webb Silversmith, NP

## 2023-01-12 ENCOUNTER — Encounter: Payer: Self-pay | Admitting: Internal Medicine

## 2023-01-12 DIAGNOSIS — I10 Essential (primary) hypertension: Secondary | ICD-10-CM | POA: Insufficient documentation

## 2023-01-12 LAB — COMPLETE METABOLIC PANEL WITH GFR
AG Ratio: 1.8 (calc) (ref 1.0–2.5)
ALT: 15 U/L (ref 9–46)
AST: 15 U/L (ref 10–35)
Albumin: 4.3 g/dL (ref 3.6–5.1)
Alkaline phosphatase (APISO): 70 U/L (ref 35–144)
BUN: 19 mg/dL (ref 7–25)
CO2: 29 mmol/L (ref 20–32)
Calcium: 9.7 mg/dL (ref 8.6–10.3)
Chloride: 104 mmol/L (ref 98–110)
Creat: 0.94 mg/dL (ref 0.70–1.22)
Globulin: 2.4 g/dL (calc) (ref 1.9–3.7)
Glucose, Bld: 95 mg/dL (ref 65–99)
Potassium: 4.7 mmol/L (ref 3.5–5.3)
Sodium: 139 mmol/L (ref 135–146)
Total Bilirubin: 0.5 mg/dL (ref 0.2–1.2)
Total Protein: 6.7 g/dL (ref 6.1–8.1)
eGFR: 79 mL/min/{1.73_m2} (ref >=60)

## 2023-01-12 LAB — LIPID PANEL
Cholesterol: 168 mg/dL (ref <200)
HDL: 49 mg/dL (ref >=40)
LDL Cholesterol (Calc): 100 mg/dL (calc) — ABNORMAL HIGH
Non-HDL Cholesterol (Calc): 119 mg/dL (calc) (ref <130)
Total CHOL/HDL Ratio: 3.4 (calc) (ref <5.0)
Triglycerides: 99 mg/dL (ref <150)

## 2023-01-12 LAB — HEMOGLOBIN A1C
Hgb A1c MFr Bld: 5.6 % of total Hgb (ref <5.7)
Mean Plasma Glucose: 114 mg/dL
eAG (mmol/L): 6.3 mmol/L

## 2023-01-12 LAB — CBC
HCT: 45.1 % (ref 38.5–50.0)
Hemoglobin: 14.7 g/dL (ref 13.2–17.1)
MCH: 28.5 pg (ref 27.0–33.0)
MCHC: 32.6 g/dL (ref 32.0–36.0)
MCV: 87.4 fL (ref 80.0–100.0)
MPV: 11.2 fL (ref 7.5–12.5)
Platelets: 239 10*3/uL (ref 140–400)
RBC: 5.16 10*6/uL (ref 4.20–5.80)
RDW: 12.6 % (ref 11.0–15.0)
WBC: 5.6 10*3/uL (ref 3.8–10.8)

## 2023-01-12 NOTE — Assessment & Plan Note (Signed)
C-Met and lipid profile today Encouraged him to consume low-fat diet 

## 2023-01-12 NOTE — Assessment & Plan Note (Signed)
Will start losartan 25 mg daily Reinforced DASH diet

## 2023-01-12 NOTE — Assessment & Plan Note (Signed)
Try to identify foods that trigger reflux Continue omeprazole and Tums as needed

## 2023-01-12 NOTE — Assessment & Plan Note (Signed)
Continue glucosamine Encourage regular physical activity

## 2023-01-12 NOTE — Assessment & Plan Note (Signed)
-   Continue finasteride 

## 2023-01-12 NOTE — Patient Instructions (Signed)

## 2023-01-26 ENCOUNTER — Encounter: Payer: Self-pay | Admitting: Internal Medicine

## 2023-01-26 ENCOUNTER — Ambulatory Visit: Payer: Medicare PPO | Admitting: Internal Medicine

## 2023-01-26 VITALS — BP 128/58 | HR 79 | Resp 18 | Wt 168.0 lb

## 2023-01-26 DIAGNOSIS — I10 Essential (primary) hypertension: Secondary | ICD-10-CM | POA: Diagnosis not present

## 2023-01-26 MED ORDER — LOSARTAN POTASSIUM 25 MG PO TABS: 25.0000 mg | ORAL_TABLET | Freq: Every day | ORAL | 1 refills | Status: DC

## 2023-01-26 NOTE — Assessment & Plan Note (Signed)
Controlled on losartan, refilled today

## 2023-01-26 NOTE — Progress Notes (Signed)
Subjective:    Patient ID: Jacob Gray, male    DOB: 02-04-1934, 89 y.o.   MRN: [id number]  HPI  Patient presents to clinic today for 2 follow-up of HTN.  At his last visit, his BP was elevated.  He was started on Losartan.  He has been taking the medication as prescribed.  His BP today is.  There is no ECG on file.  Review of Systems     No past medical history on file.  Current Outpatient Medications  Medication Sig Dispense Refill   ASPIRIN 81 PO Take by mouth.     finasteride (PROSCAR) 5 MG tablet      Glucosamine HCl (GLUCOSAMINE PO) Take by mouth 2 (two) times daily.     losartan (COZAAR) 25 MG tablet Take 1 tablet (25 mg total) by mouth daily. 30 tablet 0   Multiple Vitamin (MULTIVITAMIN) tablet Take 1 tablet by mouth daily.     omeprazole (PRILOSEC) 20 MG capsule TAKE 1 CAPSULE BY MOUTH EVERY DAY 90 capsule 3   No current facility-administered medications for this visit.    No Known Allergies  No family history on file.  Social History   Socioeconomic History   Marital status: Married    Spouse name: Krikor Reeck   Number of children: 1   Years of education: Not on file   Highest education level: Associate degree: academic program  Occupational History   Occupation: Product/process development scientist   Occupation: Retired  Tobacco Use   Smoking status: Never   Smokeless tobacco: Current    Types: Snuff  Vaping Use   Vaping Use: Never used  Substance and Sexual Activity   Alcohol use: Yes    Alcohol/week: 0.0 standard drinks of alcohol    Comment: rarely   Drug use: No   Sexual activity: Yes  Other Topics Concern   Not on file  Social History Narrative   Has a living will- would not desire prolonged life support if futile.   Social Determinants of Health   Financial Resource Strain: Low Risk  (01/07/2023)   Overall Financial Resource Strain (CARDIA)    Difficulty of Paying Living Expenses: Not hard at all  Food Insecurity: No Food Insecurity (01/07/2023)    Hunger Vital Sign    Worried About Running Out of Food in the Last Year: Never true    Ran Out of Food in the Last Year: Never true  Transportation Needs: No Transportation Needs (01/07/2023)   PRAPARE - Administrator, Civil Service (Medical): No    Lack of Transportation (Non-Medical): No  Physical Activity: Sufficiently Active (01/07/2023)   Exercise Vital Sign    Days of Exercise per Week: 7 days    Minutes of Exercise per Session: 150+ min  Stress: No Stress Concern Present (01/07/2023)   [employer] of Occupational Health - Occupational Stress Questionnaire    Feeling of Stress : Not at all  Social Connections: Socially Integrated (01/07/2023)   Social Connection and Isolation Panel [NHANES]    Frequency of Communication with Friends and Family: More than three times a week    Frequency of Social Gatherings with Friends and Family: More than three times a week    Attends Religious Services: More than 4 times per year    Active Member of [employer] or Organizations: Yes    Attends Engineer, structural: More than 4 times per year    Marital Status: Married  Catering manager Violence: Not At Risk (  09/21/2022)   Humiliation, Afraid, Rape, and Kick questionnaire    Fear of Current or Ex-Partner: No    Emotionally Abused: No    Physically Abused: No    Sexually Abused: No     Constitutional: Denies fever, malaise, fatigue, headache or abrupt weight changes.  Respiratory: Denies difficulty breathing, shortness of breath, cough or sputum production.   Cardiovascular: Denies chest pain, chest tightness, palpitations or swelling in the hands or feet.  Neurological: Denies dizziness, difficulty with memory, difficulty with speech or problems with balance and coordination.    No other specific complaints in a complete review of systems (except as listed in HPI above).  Objective:   Physical Exam  BP (!) 128/58   Pulse 79   Resp 18   Wt 168 lb (76.2 kg)    SpO2 99%   BMI 24.45 kg/m     General: Appears his stated age, well developed, well nourished in NAD. Cardiovascular: Normal rate and rhythm. S1,S2 noted.  No murmur, rubs or gallops noted. No JVD or BLE edema.  Pulmonary/Chest: Normal effort and positive vesicular breath sounds. No respiratory distress. No wheezes, rales or ronchi noted.  Musculoskeletal: No difficulty with gait.  Neurological: Alert and oriented.  Coordination normal.     BMET    Component Value Date/Time   NA 139 01/11/2023 1540   K 4.7 01/11/2023 1540   CL 104 01/11/2023 1540   CO2 29 01/11/2023 1540   GLUCOSE 95 01/11/2023 1540   BUN 19 01/11/2023 1540   CREATININE 0.94 01/11/2023 1540   CALCIUM 9.7 01/11/2023 1540    Lipid Panel     Component Value Date/Time   CHOL 168 01/11/2023 1540   TRIG 99 01/11/2023 1540   HDL 49 01/11/2023 1540   CHOLHDL 3.4 01/11/2023 1540   VLDL 17.0 07/08/2020 1546   LDLCALC 100 (H) 01/11/2023 1540    CBC    Component Value Date/Time   WBC 5.6 01/11/2023 1540   RBC 5.16 01/11/2023 1540   HGB 14.7 01/11/2023 1540   HCT 45.1 01/11/2023 1540   PLT 239 01/11/2023 1540   MCV 87.4 01/11/2023 1540   MCH 28.5 01/11/2023 1540   MCHC 32.6 01/11/2023 1540   RDW 12.6 01/11/2023 1540   LYMPHSABS 1,140 04/18/2019 1258   MONOABS 0.4 09/27/2016 1425   EOSABS 39 04/18/2019 1258   BASOSABS 31 04/18/2019 1258    Hgb A1C Lab Results  Component Value Date   HGBA1C 5.6 01/11/2023           Assessment & Plan:     RTC in 5 months for annual exam Nicki Reaper, NP

## 2023-01-26 NOTE — Patient Instructions (Signed)

## 2023-04-02 ENCOUNTER — Ambulatory Visit (HOSPITAL_COMMUNITY)
Admission: EM | Admit: 2023-04-02 | Discharge: 2023-04-02 | Disposition: A | Discharge status: Home / Self Care | Payer: Medicare PPO | Attending: Physician Assistant | Admitting: Physician Assistant

## 2023-04-02 ENCOUNTER — Encounter (HOSPITAL_COMMUNITY): Payer: Self-pay

## 2023-04-02 DIAGNOSIS — K5900 Constipation, unspecified: Secondary | ICD-10-CM

## 2023-04-02 MED ORDER — LACTULOSE 10 G PO PACK: 10.0000 g | PACK | Freq: Two times a day (BID) | ORAL | 0 refills | Status: AC

## 2023-04-02 NOTE — ED Triage Notes (Signed)
Patient here today with concerns of being constipated. He is not having any abd pain. Patient states that he feels like it is at the edge but wont come out.

## 2023-04-02 NOTE — Discharge Instructions (Addendum)
Very good to meet you today. I have sent lactulose packets to your pharmacy.  You can use 1 to 2 packets daily as directed to help with severe constipation episodes.  This evening, you may also try to take the laxatives that you picked up over-the-counter. Which ever you end up doing, be sure to push plenty of fluids.  If after 24 hours, no results, not passing gas, or severe pain, you may need to present to the emergency department.

## 2023-04-02 NOTE — ED Provider Notes (Signed)
[facility]   MRN: [id number] DOB: 12/27/1933  Subjective:   Jacob Gray is a 89 y.o. male presenting for acute constipation.  He is here with his wife today. States that for approximately the last 9 hours today he has felt like he has needed to pass a bowel movement, but has not had any success.  Feels a lot of pressure and frequent urges to pass BM.  States that he thinks his last bowel movement was a few days ago, but also notes that he does not have the best memory.  He takes Metamucil daily.  Denies any abdominal pain.  No fever or chills.  No blood in the stool.  He has been passing gas and small amounts of liquid stool today.  States that he tried enemas, but they would not stay in place.  No current facility-administered medications for this encounter.  Current Outpatient Medications:    ASPIRIN 81 PO, Take by mouth., Disp: , Rfl:    finasteride (PROSCAR) 5 MG tablet, , Disp: , Rfl:    Glucosamine HCl (GLUCOSAMINE PO), Take by mouth 2 (two) times daily., Disp: , Rfl:    lactulose (CEPHULAC) 10 g packet, Take 1 packet (10 g total) by mouth 2 (two) times daily. Use as needed for severe constipation., Disp: 15 each, Rfl: 0   losartan (COZAAR) 25 MG tablet, Take 1 tablet (25 mg total) by mouth daily., Disp: 90 tablet, Rfl: 1   Multiple Vitamin (MULTIVITAMIN) tablet, Take 1 tablet by mouth daily., Disp: , Rfl:    omeprazole (PRILOSEC) 20 MG capsule, TAKE 1 CAPSULE BY MOUTH EVERY DAY, Disp: 90 capsule, Rfl: 3   No Known Allergies  History reviewed. No pertinent past medical history.   Past Surgical History:  Procedure Laterality Date   PROSTATE BIOPSY      History reviewed. No pertinent family history.  Social History   Tobacco Use   Smoking status: Never   Smokeless tobacco: Current    Types: Snuff  Vaping Use   Vaping Use: Never used  Substance Use Topics   Alcohol use: Yes    Alcohol/week: 0.0 standard drinks of alcohol    Comment: rarely    Drug use: No    ROS REFER TO HPI FOR PERTINENT POSITIVES AND NEGATIVES   Objective:   Vitals: BP (!) 175/87   Pulse 95   Temp 98.6 F (37 C) (Oral)   Resp 16   Ht 6' (1.829 m)   Wt 165 lb (74.8 kg)   SpO2 95%   BMI 22.38 kg/m   Physical Exam Vitals and nursing note reviewed.  Constitutional:      General: He is not in acute distress.    Appearance: Normal appearance. He is not ill-appearing.  Abdominal:     General: Abdomen is flat. Bowel sounds are normal.     Palpations: Abdomen is soft.     Tenderness: There is no abdominal tenderness. There is no right CVA tenderness or left CVA tenderness.  Neurological:     General: No focal deficit present.     Mental Status: He is alert and oriented to person, place, and time.  Psychiatric:        Mood and Affect: Mood normal.     No results found for this or any previous visit (from the past 24 hour(s)).  Assessment and Plan :   PDMP not reviewed this encounter.  1. Constipation, unspecified constipation type    No red  flags on exam.  Thankfully he is passing gas and liquid stool.  Encouraged him to try lactulose packets, which I have sent to his pharmacy.  He also picked up laxatives over-the-counter, which he could try this evening in place of the lactulose.  Encouraged him to push fluids.  He needs to continue Metamucil daily.  ED precautions discussed with the patient.  Him and his wife were agreeable and understanding of the plan.     AllwardtCrist Infante, PA-C 04/02/23 1835

## 2023-05-18 DIAGNOSIS — H02106 Unspecified ectropion of left eye, unspecified eyelid: Secondary | ICD-10-CM | POA: Diagnosis not present

## 2023-05-18 DIAGNOSIS — H02103 Unspecified ectropion of right eye, unspecified eyelid: Secondary | ICD-10-CM | POA: Diagnosis not present

## 2023-05-18 DIAGNOSIS — H2513 Age-related nuclear cataract, bilateral: Secondary | ICD-10-CM | POA: Diagnosis not present

## 2023-06-02 ENCOUNTER — Other Ambulatory Visit: Payer: Self-pay | Admitting: Internal Medicine

## 2023-06-03 NOTE — Telephone Encounter (Signed)
Requested Prescriptions  Pending Prescriptions Disp Refills   omeprazole (PRILOSEC) 20 MG capsule [Pharmacy Med Name: OMEPRAZOLE DR 20 MG CAPSULE] 90 capsule 1    Sig: TAKE 1 CAPSULE BY MOUTH EVERY DAY     Gastroenterology: Proton Pump Inhibitors Passed - 06/02/2023  2:39 PM      Passed - Valid encounter within last 12 months    Recent Outpatient Visits           4 months ago Primary hypertension   [place] [facility] [place], [place] W, NP   4 months ago Elevated random blood glucose level   [place] [facility] [place], [place] W, NP   9 months ago Elevated blood pressure reading in office without diagnosis of hypertension   [place] [facility] [place], Salvadore Oxford, NP   10 months ago Encounter for general adult medical examination with abnormal findings   [place] [facility] [place], Salvadore Oxford, NP   1 year ago Medicare annual wellness visit, subsequent   [place] [facility] [place], Salvadore Oxford, NP       Future Appointments             In 1 month [place], Salvadore Oxford, NP  [facility]

## 2023-06-06 DIAGNOSIS — H6122 Impacted cerumen, left ear: Secondary | ICD-10-CM | POA: Diagnosis not present

## 2023-06-21 ENCOUNTER — Encounter: Payer: Self-pay | Admitting: Internal Medicine

## 2023-06-23 DIAGNOSIS — Z08 Encounter for follow-up examination after completed treatment for malignant neoplasm: Secondary | ICD-10-CM | POA: Diagnosis not present

## 2023-06-23 DIAGNOSIS — C44612 Basal cell carcinoma of skin of right upper limb, including shoulder: Secondary | ICD-10-CM | POA: Diagnosis not present

## 2023-06-23 DIAGNOSIS — L821 Other seborrheic keratosis: Secondary | ICD-10-CM | POA: Diagnosis not present

## 2023-06-23 DIAGNOSIS — D225 Melanocytic nevi of trunk: Secondary | ICD-10-CM | POA: Diagnosis not present

## 2023-06-23 DIAGNOSIS — Z85828 Personal history of other malignant neoplasm of skin: Secondary | ICD-10-CM | POA: Diagnosis not present

## 2023-06-23 DIAGNOSIS — L570 Actinic keratosis: Secondary | ICD-10-CM | POA: Diagnosis not present

## 2023-06-23 DIAGNOSIS — L814 Other melanin hyperpigmentation: Secondary | ICD-10-CM | POA: Diagnosis not present

## 2023-07-15 ENCOUNTER — Ambulatory Visit (INDEPENDENT_AMBULATORY_CARE_PROVIDER_SITE_OTHER): Payer: Medicare PPO | Admitting: Internal Medicine

## 2023-07-15 ENCOUNTER — Encounter: Payer: Self-pay | Admitting: Internal Medicine

## 2023-07-15 VITALS — BP 138/72 | HR 52 | Temp 96.9°F | Wt 166.0 lb

## 2023-07-15 DIAGNOSIS — E782 Mixed hyperlipidemia: Secondary | ICD-10-CM | POA: Diagnosis not present

## 2023-07-15 DIAGNOSIS — Z0001 Encounter for general adult medical examination with abnormal findings: Secondary | ICD-10-CM

## 2023-07-15 DIAGNOSIS — Z125 Encounter for screening for malignant neoplasm of prostate: Secondary | ICD-10-CM

## 2023-07-15 DIAGNOSIS — R7309 Other abnormal glucose: Secondary | ICD-10-CM | POA: Diagnosis not present

## 2023-07-15 DIAGNOSIS — Z23 Encounter for immunization: Secondary | ICD-10-CM

## 2023-07-15 DIAGNOSIS — R739 Hyperglycemia, unspecified: Secondary | ICD-10-CM

## 2023-07-15 NOTE — Patient Instructions (Signed)
Health Maintenance After Age 87 After age 87, you are at a higher risk for certain long-term diseases and infections as well as injuries from falls. Falls are a major cause of broken bones and head injuries in people who are older than age 87. Getting regular preventive care can help to keep you healthy and well. Preventive care includes getting regular testing and making lifestyle changes as recommended by your health care provider. Talk with your health care provider about: Which screenings and tests you should have. A screening is a test that checks for a disease when you have no symptoms. A diet and exercise plan that is right for you. What should I know about screenings and tests to prevent falls? Screening and testing are the best ways to find a health problem early. Early diagnosis and treatment give you the best chance of managing medical conditions that are common after age 87. Certain conditions and lifestyle choices may make you more likely to have a fall. Your health care provider may recommend: Regular vision checks. Poor vision and conditions such as cataracts can make you more likely to have a fall. If you wear glasses, make sure to get your prescription updated if your vision changes. Medicine review. Work with your health care provider to regularly review all of the medicines you are taking, including over-the-counter medicines. Ask your health care provider about any side effects that may make you more likely to have a fall. Tell your health care provider if any medicines that you take make you feel dizzy or sleepy. Strength and balance checks. Your health care provider may recommend certain tests to check your strength and balance while standing, walking, or changing positions. Foot health exam. Foot pain and numbness, as well as not wearing proper footwear, can make you more likely to have a fall. Screenings, including: Osteoporosis screening. Osteoporosis is a condition that causes  the bones to get weaker and break more easily. Blood pressure screening. Blood pressure changes and medicines to control blood pressure can make you feel dizzy. Depression screening. You may be more likely to have a fall if you have a fear of falling, feel depressed, or feel unable to do activities that you used to do. Alcohol use screening. Using too much alcohol can affect your balance and may make you more likely to have a fall. Follow these instructions at home: Lifestyle Do not drink alcohol if: Your health care provider tells you not to drink. If you drink alcohol: Limit how much you have to: 0-1 drink a day for women. 0-2 drinks a day for men. Know how much alcohol is in your drink. In the [place] one drink equals one 12 oz bottle of beer (355 mL), one 5 oz glass of wine (148 mL), or one 1 oz glass of hard liquor (44 mL). Do not use any products that contain nicotine or tobacco. These products include cigarettes, chewing tobacco, and vaping devices, such as e-cigarettes. If you need help quitting, ask your health care provider. Activity  Follow a regular exercise program to stay fit. This will help you maintain your balance. Ask your health care provider what types of exercise are appropriate for you. If you need a cane or walker, use it as recommended by your health care provider. Wear supportive shoes that have nonskid soles. Safety  Remove any tripping hazards, such as rugs, cords, and clutter. Install safety equipment such as grab bars in bathrooms and safety rails on stairs. Keep rooms and walkways   well-lit. General instructions Talk with your health care provider about your risks for falling. Tell your health care provider if: You fall. Be sure to tell your health care provider about all falls, even ones that seem minor. You feel dizzy, tiredness (fatigue), or off-balance. Take over-the-counter and prescription medicines only as told by your health care provider. These include  supplements. Eat a healthy diet and maintain a healthy weight. A healthy diet includes low-fat dairy products, low-fat (lean) meats, and fiber from whole grains, beans, and lots of fruits and vegetables. Stay current with your vaccines. Schedule regular health, dental, and eye exams. Summary Having a healthy lifestyle and getting preventive care can help to protect your health and wellness after age 87. Screening and testing are the best way to find a health problem early and help you avoid having a fall. Early diagnosis and treatment give you the best chance for managing medical conditions that are more common for people who are older than age 87. Falls are a major cause of broken bones and head injuries in people who are older than age 87. Take precautions to prevent a fall at home. Work with your health care provider to learn what changes you can make to improve your health and wellness and to prevent falls. This information is not intended to replace advice given to you by your health care provider. Make sure you discuss any questions you have with your health care provider. Document Revised: 02/23/2021 Document Reviewed: 02/23/2021 Elsevier Patient Education  2024 [employer]  

## 2023-07-15 NOTE — Progress Notes (Signed)
Subjective:    Patient ID: Jacob Gray, male    DOB: 12-26-1933, 89 y.o.   MRN: [id number]  HPI  Patient presents to clinic today for his annual exam.  Flu: 06/2022 Tetanus: 06/2016 COVID: X 3 Pneumovax: 07/2009 Prevnar: 09/2015 Shingrix: 07/2019, 09/2019 PSA screening: 06/2022 Vision screening: annually Dentist: as needed  Diet: He does eat meat. He consumes fruits and veggies. He does eat fried foods. He drinks mostly water, coffee, milk Exercise: golfing   Review of Systems     No past medical history on file.  Current Outpatient Medications  Medication Sig Dispense Refill   ASPIRIN 81 PO Take by mouth.     finasteride (PROSCAR) 5 MG tablet      Glucosamine HCl (GLUCOSAMINE PO) Take by mouth 2 (two) times daily.     lactulose (CEPHULAC) 10 g packet Take 1 packet (10 g total) by mouth 2 (two) times daily. Use as needed for severe constipation. 15 each 0   losartan (COZAAR) 25 MG tablet Take 1 tablet (25 mg total) by mouth daily. 90 tablet 1   Multiple Vitamin (MULTIVITAMIN) tablet Take 1 tablet by mouth daily.     omeprazole (PRILOSEC) 20 MG capsule TAKE 1 CAPSULE BY MOUTH EVERY DAY 90 capsule 1   No current facility-administered medications for this visit.    No Known Allergies  No family history on file.  Social History   Socioeconomic History   Marital status: Married    Spouse name: Kanoa Phillippi   Number of children: 1   Years of education: Not on file   Highest education level: Associate degree: academic program  Occupational History   Occupation: Product/process development scientist   Occupation: Retired  Tobacco Use   Smoking status: Never   Smokeless tobacco: Current    Types: Snuff  Vaping Use   Vaping status: Never Used  Substance and Sexual Activity   Alcohol use: Yes    Alcohol/week: 0.0 standard drinks of alcohol    Comment: rarely   Drug use: No   Sexual activity: Yes  Other Topics Concern   Not on file  Social History Narrative   Has a  living will- would not desire prolonged life support if futile.   Social Determinants of Health   Financial Resource Strain: Low Risk  (01/07/2023)   Overall Financial Resource Strain (CARDIA)    Difficulty of Paying Living Expenses: Not hard at all  Food Insecurity: Low Risk  (06/06/2023)   Received from [facility]   Hunger Vital Sign    Worried About Running Out of Food in the Last Year: Never true    Ran Out of Food in the Last Year: Never true  Transportation Needs: Not on file (06/06/2023)  Physical Activity: Sufficiently Active (01/07/2023)   Exercise Vital Sign    Days of Exercise per Week: 7 days    Minutes of Exercise per Session: 150+ min  Stress: No Stress Concern Present (01/07/2023)   [employer] of Occupational Health - Occupational Stress Questionnaire    Feeling of Stress : Not at all  Social Connections: Socially Integrated (01/07/2023)   Social Connection and Isolation Panel [NHANES]    Frequency of Communication with Friends and Family: More than three times a week    Frequency of Social Gatherings with Friends and Family: More than three times a week    Attends Religious Services: More than 4 times per year    Active Member of [employer] or Organizations: Yes  Attends Banker Meetings: More than 4 times per year    Marital Status: Married  Catering manager Violence: Not At Risk (09/21/2022)   Humiliation, Afraid, Rape, and Kick questionnaire    Fear of Current or Ex-Partner: No    Emotionally Abused: No    Physically Abused: No    Sexually Abused: No     Constitutional: Denies fever, malaise, fatigue, headache or abrupt weight changes.  HEENT: Denies eye pain, eye redness, ear pain, ringing in the ears, wax buildup, runny nose, nasal congestion, bloody nose, or sore throat. Respiratory: Denies difficulty breathing, shortness of breath, cough or sputum production.   Cardiovascular: Denies chest pain, chest tightness, palpitations or swelling in  the hands or feet.  Gastrointestinal: Pt reports intermittent constipation. Denies abdominal pain, bloating, diarrhea or blood in the stool.  GU: Denies urgency, frequency, pain with urination, burning sensation, blood in urine, odor or discharge. Musculoskeletal: Patient reports joint pain.  Denies decrease in range of motion, difficulty with gait, muscle pain or joint swelling.  Skin: Denies redness, rashes, lesions or ulcercations.  Neurological: Denies dizziness, difficulty with memory, difficulty with speech or problems with balance and coordination.  Psych: Denies anxiety, depression, SI/HI.  No other specific complaints in a complete review of systems (except as listed in HPI above).  Objective:   Physical Exam    BP 138/72 (BP Location: Right Arm, Patient Position: Sitting, Cuff Size: Normal)   Pulse (!) 52   Temp (!) 96.9 F (36.1 C) (Temporal)   Wt 166 lb (75.3 kg)   SpO2 97%   BMI 22.51 kg/m   Wt Readings from Last 3 Encounters:  04/02/23 165 lb (74.8 kg)  01/26/23 168 lb (76.2 kg)  01/11/23 168 lb (76.2 kg)    General: Appears his stated age, well developed, well nourished in NAD. Skin: Warm, dry and intact.  HEENT: Head: normal shape and size; Eyes: sclera white, no icterus, conjunctiva pink, PERRLA and EOMs intact;  Neck:  Neck supple, trachea midline. No masses, lumps or thyromegaly present.  Cardiovascular: Bradycardic with normal rhythm. S1,S2 noted.  No murmur, rubs or gallops noted. No JVD or BLE edema. No carotid bruits noted. Pulmonary/Chest: Normal effort and positive vesicular breath sounds. No respiratory distress. No wheezes, rales or ronchi noted.  Abdomen: Soft and nontender. Normal bowel sounds.  Musculoskeletal: Strength 5/5 BUE/BLE. No difficulty with gait.  Neurological: Alert and oriented. Cranial nerves II-XII grossly intact. Coordination normal.  Psychiatric: Mood and affect normal. Behavior is normal. Judgment and thought content normal.     BMET    Component Value Date/Time   NA 139 01/11/2023 1540   K 4.7 01/11/2023 1540   CL 104 01/11/2023 1540   CO2 29 01/11/2023 1540   GLUCOSE 95 01/11/2023 1540   BUN 19 01/11/2023 1540   CREATININE 0.94 01/11/2023 1540   CALCIUM 9.7 01/11/2023 1540    Lipid Panel     Component Value Date/Time   CHOL 168 01/11/2023 1540   TRIG 99 01/11/2023 1540   HDL 49 01/11/2023 1540   CHOLHDL 3.4 01/11/2023 1540   VLDL 17.0 07/08/2020 1546   LDLCALC 100 (H) 01/11/2023 1540    CBC    Component Value Date/Time   WBC 5.6 01/11/2023 1540   RBC 5.16 01/11/2023 1540   HGB 14.7 01/11/2023 1540   HCT 45.1 01/11/2023 1540   PLT 239 01/11/2023 1540   MCV 87.4 01/11/2023 1540   MCH 28.5 01/11/2023 1540   MCHC 32.6  01/11/2023 1540   RDW 12.6 01/11/2023 1540   LYMPHSABS 1,140 04/18/2019 1258   MONOABS 0.4 09/27/2016 1425   EOSABS 39 04/18/2019 1258   BASOSABS 31 04/18/2019 1258    Hgb A1C Lab Results  Component Value Date   HGBA1C 5.6 01/11/2023          Assessment & Plan:   Preventative health maintenance:  Flu shot today Tetanus UTD Encouraged him to get his COVID booster Prevnar 20 today Shingrix UTD He no longer needs to screen for colon cancer Encouraged him to consume a balanced diet and exercise regimen Advised him to see an eye doctor and dentist annually We will check CBC, c-Met, lipid, A1c and PSA today  RTC in 6 months, follow-up chronic conditions Nicki Reaper, NP

## 2023-07-16 LAB — CBC
HCT: 45.4 % (ref 38.5–50.0)
Hemoglobin: 14.4 g/dL (ref 13.2–17.1)
MCH: 28.6 pg (ref 27.0–33.0)
MCHC: 31.7 g/dL — ABNORMAL LOW (ref 32.0–36.0)
MCV: 90.1 fL (ref 80.0–100.0)
MPV: 10.9 fL (ref 7.5–12.5)
Platelets: 242 10*3/uL (ref 140–400)
RBC: 5.04 10*6/uL (ref 4.20–5.80)
RDW: 12.5 % (ref 11.0–15.0)
WBC: 5.9 10*3/uL (ref 3.8–10.8)

## 2023-07-16 LAB — LIPID PANEL
Cholesterol: 159 mg/dL (ref <200)
HDL: 40 mg/dL (ref >=40)
LDL Cholesterol (Calc): 97 mg/dL
Non-HDL Cholesterol (Calc): 119 mg/dL (ref <130)
Total CHOL/HDL Ratio: 4 (calc) (ref <5.0)
Triglycerides: 128 mg/dL (ref <150)

## 2023-07-16 LAB — HEMOGLOBIN A1C
Hgb A1c MFr Bld: 5.8 %{Hb} — ABNORMAL HIGH (ref <5.7)
Mean Plasma Glucose: 120 mg/dL
eAG (mmol/L): 6.6 mmol/L

## 2023-07-16 LAB — COMPLETE METABOLIC PANEL WITH GFR
AG Ratio: 1.6 (calc) (ref 1.0–2.5)
ALT: 17 U/L (ref 9–46)
AST: 13 U/L (ref 10–35)
Albumin: 3.9 g/dL (ref 3.6–5.1)
Alkaline phosphatase (APISO): 79 U/L (ref 35–144)
BUN: 19 mg/dL (ref 7–25)
CO2: 29 mmol/L (ref 20–32)
Calcium: 9.8 mg/dL (ref 8.6–10.3)
Chloride: 104 mmol/L (ref 98–110)
Creat: 0.91 mg/dL (ref 0.70–1.22)
Globulin: 2.5 g/dL (ref 1.9–3.7)
Glucose, Bld: 139 mg/dL — ABNORMAL HIGH (ref 65–99)
Potassium: 5.3 mmol/L (ref 3.5–5.3)
Sodium: 137 mmol/L (ref 135–146)
Total Bilirubin: 0.6 mg/dL (ref 0.2–1.2)
Total Protein: 6.4 g/dL (ref 6.1–8.1)
eGFR: 82 mL/min/{1.73_m2} (ref >=60)

## 2023-07-16 LAB — PSA: PSA: 1.78 ng/mL (ref <=4.00)

## 2023-08-03 ENCOUNTER — Other Ambulatory Visit: Payer: Self-pay | Admitting: Internal Medicine

## 2023-08-03 NOTE — Telephone Encounter (Signed)
Requested Prescriptions  Pending Prescriptions Disp Refills   losartan (COZAAR) 25 MG tablet [Pharmacy Med Name: LOSARTAN POTASSIUM 25 MG TAB] 90 tablet 1    Sig: TAKE 1 TABLET (25 MG TOTAL) BY MOUTH DAILY.     Cardiovascular:  Angiotensin Receptor Blockers Passed - 08/03/2023  1:29 AM      Passed - Cr in normal range and within 180 days    Creat  Date Value Ref Range Status  07/15/2023 0.91 0.70 - 1.22 mg/dL Final         Passed - K in normal range and within 180 days    Potassium  Date Value Ref Range Status  07/15/2023 5.3 3.5 - 5.3 mmol/L Final         Passed - Patient is not pregnant      Passed - Last BP in normal range    BP Readings from Last 1 Encounters:  07/15/23 138/72         Passed - Valid encounter within last 6 months    Recent Outpatient Visits           2 weeks ago Encounter for general adult medical examination with abnormal findings   [place] [facility] [place], Salvadore Oxford, NP   6 months ago Primary hypertension   [place] [facility] [place], [place] W, NP   6 months ago Elevated random blood glucose level   [place] [place], [place] W, NP   11 months ago Elevated blood pressure reading in office without diagnosis of hypertension   [place] [facility] [place], Salvadore Oxford, NP   1 year ago Encounter for general adult medical examination with abnormal findings   [place] [facility] [place], Salvadore Oxford, NP       Future Appointments             In 5 months [place], Salvadore Oxford, NP [place] [facility], [facility]

## 2023-08-09 DIAGNOSIS — M1712 Unilateral primary osteoarthritis, left knee: Secondary | ICD-10-CM | POA: Diagnosis not present

## 2023-08-23 DIAGNOSIS — L821 Other seborrheic keratosis: Secondary | ICD-10-CM | POA: Diagnosis not present

## 2023-08-23 DIAGNOSIS — L570 Actinic keratosis: Secondary | ICD-10-CM | POA: Diagnosis not present

## 2023-08-23 DIAGNOSIS — Z85828 Personal history of other malignant neoplasm of skin: Secondary | ICD-10-CM | POA: Diagnosis not present

## 2023-08-23 DIAGNOSIS — Z08 Encounter for follow-up examination after completed treatment for malignant neoplasm: Secondary | ICD-10-CM | POA: Diagnosis not present

## 2023-08-23 DIAGNOSIS — D225 Melanocytic nevi of trunk: Secondary | ICD-10-CM | POA: Diagnosis not present

## 2023-08-23 DIAGNOSIS — L814 Other melanin hyperpigmentation: Secondary | ICD-10-CM | POA: Diagnosis not present

## 2023-09-23 ENCOUNTER — Ambulatory Visit (INDEPENDENT_AMBULATORY_CARE_PROVIDER_SITE_OTHER): Payer: Medicare PPO

## 2023-09-23 DIAGNOSIS — Z0000 Encounter for general adult medical examination without abnormal findings: Secondary | ICD-10-CM | POA: Diagnosis not present

## 2023-09-23 NOTE — Patient Instructions (Addendum)
Jacob Gray , Thank you for taking time to come for your Medicare Wellness Visit. I appreciate your ongoing commitment to your health goals. Please review the following plan we discussed and let me know if I can assist you in the future.   Referrals/Orders/Follow-Ups/Clinician Recommendations: none  This is a list of the screening recommended for you and due dates:  Health Maintenance  Topic Date Due   COVID-19 Vaccine (4 - 2023-24 season) 06/19/2023   Medicare Annual Wellness Visit  09/22/2024   DTaP/Tdap/Td vaccine (3 - Td or Tdap) 07/08/2026   Pneumonia Vaccine  Completed   Flu Shot  Completed   Zoster (Shingles) Vaccine  Completed   HPV Vaccine  Aged Out    Advanced directives: (ACP Link)Information on Advanced Care Planning can be found at [facility] of [place] Advance Health Care Directives Advance Health Care Directives ([email or web address]   Next Medicare Annual Wellness Visit scheduled for next year: Yes   09/28/24 @ 2:00 pm by video

## 2023-09-23 NOTE — Progress Notes (Signed)
Subjective:   Jacob Gray is a 89 y.o. male who presents for Medicare Annual/Subsequent preventive examination.  Visit Complete: Virtual I connected with  Jerelyn Scott on 09/23/23 by a audio enabled telemedicine application and verified that I am speaking with the correct person using two identifiers.  Patient Location: Home  Provider Location: Office/Clinic  I discussed the limitations of evaluation and management by telemedicine. The patient expressed understanding and agreed to proceed.  Vital Signs: Because this visit was a virtual/telehealth visit, some criteria may be missing or patient reported. Any vitals not documented were not able to be obtained and vitals that have been documented are patient reported.  Cardiac Risk Factors include: advanced age (>76men, >59 women);dyslipidemia;male gender;hypertension     Objective:    There were no vitals filed for this visit. There is no height or weight on file to calculate BMI.     09/23/2023    2:39 PM 09/27/2016    2:04 PM  Advanced Directives  Does Patient Have a Medical Advance Directive? No Yes  Type of Special educational needs teacher of [place];Living will  Copy of Healthcare Power of Attorney in Chart?  No - copy requested  Would patient like information on creating a medical advance directive? No - Patient declined     Current Medications (verified) Outpatient Encounter Medications as of 09/23/2023  Medication Sig   ASPIRIN 81 PO Take by mouth.   finasteride (PROSCAR) 5 MG tablet    Glucosamine HCl (GLUCOSAMINE PO) Take by mouth 2 (two) times daily.   lactulose (CEPHULAC) 10 g packet Take 1 packet (10 g total) by mouth 2 (two) times daily. Use as needed for severe constipation.   losartan (COZAAR) 25 MG tablet TAKE 1 TABLET (25 MG TOTAL) BY MOUTH DAILY.   Multiple Vitamin (MULTIVITAMIN) tablet Take 1 tablet by mouth daily.   omeprazole (PRILOSEC) 20 MG capsule TAKE 1 CAPSULE BY MOUTH EVERY DAY    No facility-administered encounter medications on file as of 09/23/2023.    Allergies (verified) Patient has no known allergies.   History: History reviewed. No pertinent past medical history. Past Surgical History:  Procedure Laterality Date   PROSTATE BIOPSY     History reviewed. No pertinent family history. Social History   Socioeconomic History   Marital status: Married    Spouse name: Lemoine Tivnan   Number of children: 1   Years of education: Not on file   Highest education level: Associate degree: academic program  Occupational History   Occupation: Product/process development scientist   Occupation: Retired  Tobacco Use   Smoking status: Never   Smokeless tobacco: Current    Types: Snuff  Vaping Use   Vaping status: Never Used  Substance and Sexual Activity   Alcohol use: Yes    Alcohol/week: 0.0 standard drinks of alcohol    Comment: rarely   Drug use: No   Sexual activity: Yes  Other Topics Concern   Not on file  Social History Narrative   Has a living will- would not desire prolonged life support if futile.   Social Determinants of Health   Financial Resource Strain: Low Risk  (09/23/2023)   Overall Financial Resource Strain (CARDIA)    Difficulty of Paying Living Expenses: Not hard at all  Food Insecurity: No Food Insecurity (09/23/2023)   Hunger Vital Sign    Worried About Running Out of Food in the Last Year: Never true    Ran Out of Food in the Last Year:  Never true  Transportation Needs: No Transportation Needs (09/23/2023)   PRAPARE - Administrator, Civil Service (Medical): No    Lack of Transportation (Non-Medical): No  Physical Activity: Sufficiently Active (09/23/2023)   Exercise Vital Sign    Days of Exercise per Week: 6 days    Minutes of Exercise per Session: 60 min  Stress: No Stress Concern Present (09/23/2023)   [employer] of Occupational Health - Occupational Stress Questionnaire    Feeling of Stress : Not at all  Social  Connections: Moderately Integrated (09/23/2023)   Social Connection and Isolation Panel [NHANES]    Frequency of Communication with Friends and Family: More than three times a week    Frequency of Social Gatherings with Friends and Family: More than three times a week    Attends Religious Services: More than 4 times per year    Active Member of [employer] or Organizations: No    Attends Engineer, structural: Never    Marital Status: Married    Tobacco Counseling Ready to quit: Not Answered Counseling given: Not Answered   Clinical Intake:  Pre-visit preparation completed: Yes  Pain : No/denies pain     Nutritional Risks: None Diabetes: No  How often do you need to have someone help you when you read instructions, pamphlets, or other written materials from your doctor or pharmacy?: 1 - Never  Interpreter Needed?: No  Information entered by :: Kennedy Bucker, LPN   Activities of Daily Living    09/23/2023    2:40 PM 07/15/2023    1:50 PM  In your present state of health, do you have any difficulty performing the following activities:  Hearing? 1 1  Vision? 0 1  Difficulty concentrating or making decisions? 0 0  Walking or climbing stairs? 0 0  Dressing or bathing? 0 0  Doing errands, shopping? 0 0  Preparing Food and eating ? N   Using the Toilet? N   In the past six months, have you accidently leaked urine? N   Do you have problems with loss of bowel control? N   Managing your Medications? N   Managing your Finances? N   Housekeeping or managing your Housekeeping? N     Patient Care Team: Lorre Munroe, NP as PCP - General (Internal Medicine) Jerilee Field, MD as Consulting Physician (Urology) Cherlyn Roberts, MD as Consulting Physician (Dermatology) Luane School, OD as Consulting Physician (Optometry) Jonn Shingles, MD as Referring Physician (Orthopedic Surgery)  Indicate any recent Medical Services you may have received from other than  [facility] providers in the past year (date may be approximate).     Assessment:   This is a routine wellness examination for Jacob Gray.  Hearing/Vision screen Hearing Screening - Comments:: Wears aids, both ears Vision Screening - Comments:: Wears glasses- [facility] MD   Goals Addressed             This Visit's Progress    DIET - EAT MORE FRUITS AND VEGETABLES         Depression Screen    09/23/2023    2:38 PM 07/15/2023    1:50 PM 01/12/2023    7:12 AM 09/21/2022    2:51 PM 07/13/2022    3:26 PM 07/10/2021    1:40 PM 07/08/2020    3:25 PM  PHQ 2/9 Scores  PHQ - 2 Score 0 0 0 0 0 0 0  PHQ- 9 Score 0     0  Fall Risk    09/23/2023    2:40 PM 07/15/2023    1:50 PM 01/12/2023    7:12 AM 09/21/2022    2:51 PM 07/10/2021    1:43 PM  Fall Risk   Falls in the past year? 0 0 0 0 0  Number falls in past yr: 0   0 0  Injury with Fall? 0 0 0 0 0  Risk for fall due to : No Fall Risks No Fall Risks No Fall Risks No Fall Risks No Fall Risks  Follow up Falls prevention discussed;Falls evaluation completed   Falls evaluation completed Falls evaluation completed    MEDICARE RISK AT HOME: Medicare Risk at Home Any stairs in or around the home?: Yes If so, are there any without handrails?: No Home free of loose throw rugs in walkways, pet beds, electrical cords, etc?: Yes Adequate lighting in your home to reduce risk of falls?: Yes Life alert?: No Use of a cane, walker or w/c?: No Grab bars in the bathroom?: Yes Shower chair or bench in shower?: Yes Elevated toilet seat or a handicapped toilet?: Yes  TIMED UP AND GO:  Was the test performed?  No    Cognitive Function:    09/27/2016    2:25 PM 09/27/2016    2:13 PM  MMSE - Mini Mental State Exam  Orientation to time  5  Orientation to Place  5  Registration  3  Attention/ Calculation  0  Recall -- 2  Recall-comments pt was unable to recall 1 of 3 words   Language- name 2 objects  0  Language- repeat  1  Language- follow  3 step command  3  Language- read & follow direction  0  Write a sentence  0  Copy design  0  Total score  19        09/23/2023    2:41 PM 09/21/2022    2:52 PM  6CIT Screen  What Year? 0 points 0 points  What month? 0 points 0 points  What time? 0 points 0 points  Count back from 20 0 points 0 points  Months in reverse 0 points 0 points  Repeat phrase 0 points 0 points  Total Score 0 points 0 points    Immunizations Immunization History  Administered Date(s) Administered   Fluad Quad(high Dose 65+) 06/27/2019, 07/08/2020, 07/10/2021, 07/13/2022   Fluad Trivalent(High Dose 65+) 07/15/2023   Influenza Whole 08/18/2007, 07/23/2008, 09/08/2010   Influenza, Seasonal, Injecte, Preservative Fre 07/13/2016   Influenza,inj,Quad PF,6+ Mos 07/27/2018   Influenza-Unspecified 09/02/2014, 07/03/2015   Moderna Sars-Covid-2 Vaccination 10/29/2019, 11/24/2019, 04/16/2021   PNEUMOCOCCAL CONJUGATE-20 07/15/2023   Pneumococcal Conjugate-13 10/07/2015   Pneumococcal Polysaccharide-23 07/22/2009   Td 07/22/2009   Tdap 07/08/2016   Zoster Recombinant(Shingrix) 08/08/2019, 10/11/2019    TDAP status: Up to date  Flu Vaccine status: Up to date  Pneumococcal vaccine status: Up to date  Covid-19 vaccine status: Completed vaccines  Qualifies for Shingles Vaccine? Yes   Zostavax completed No   Shingrix Completed?: Yes  Screening Tests Health Maintenance  Topic Date Due   COVID-19 Vaccine (4 - 2023-24 season) 06/19/2023   Medicare Annual Wellness (AWV)  09/22/2024   DTaP/Tdap/Td (3 - Td or Tdap) 07/08/2026   Pneumonia Vaccine 5+ Years old  Completed   INFLUENZA VACCINE  Completed   Zoster Vaccines- Shingrix  Completed   HPV VACCINES  Aged Out    Health Maintenance  Health Maintenance Due  Topic Date Due  COVID-19 Vaccine (4 - 2023-24 season) 06/19/2023    Colorectal cancer screening: No longer required.   Lung Cancer Screening: (Low Dose CT Chest recommended if Age 40-80  years, 20 pack-year currently smoking OR have quit w/in 15years.) does not qualify.    Additional Screening:  Hepatitis C Screening: does not qualify; Completed no  Vision Screening: Recommended annual ophthalmology exams for early detection of glaucoma and other disorders of the eye. Is the patient up to date with their annual eye exam?  Yes  Who is the provider or what is the name of the office in which the patient attends annual eye exams? [facility] If pt is not established with a provider, would they like to be referred to a provider to establish care? No .   Dental Screening: Recommended annual dental exams for proper oral hygiene    Community Resource Referral / Chronic Care Management: CRR required this visit?  No   CCM required this visit?  No     Plan:     I have personally reviewed and noted the following in the patient's chart:   Medical and social history Use of alcohol, tobacco or illicit drugs  Current medications and supplements including opioid prescriptions. Patient is not currently taking opioid prescriptions. Functional ability and status Nutritional status Physical activity Advanced directives List of other physicians Hospitalizations, surgeries, and ER visits in previous 12 months Vitals Screenings to include cognitive, depression, and falls Referrals and appointments  In addition, I have reviewed and discussed with patient certain preventive protocols, quality metrics, and best practice recommendations. A written personalized care plan for preventive services as well as general preventive health recommendations were provided to patient.     Hal Hope, LPN   40/06/8118   After Visit Summary: (MyChart) Due to this being a telephonic visit, the after visit summary with patients personalized plan was offered to patient via MyChart   Nurse Notes: none

## 2023-10-03 DIAGNOSIS — H6123 Impacted cerumen, bilateral: Secondary | ICD-10-CM | POA: Diagnosis not present

## 2023-11-03 ENCOUNTER — Encounter: Payer: Self-pay | Admitting: Physician Assistant

## 2023-11-03 ENCOUNTER — Ambulatory Visit: Payer: Self-pay

## 2023-11-03 ENCOUNTER — Ambulatory Visit: Payer: Medicare PPO | Admitting: Physician Assistant

## 2023-11-03 VITALS — BP 134/76 | HR 67 | Resp 16 | Ht 72.0 in | Wt 157.0 lb

## 2023-11-03 DIAGNOSIS — L0231 Cutaneous abscess of buttock: Secondary | ICD-10-CM | POA: Diagnosis not present

## 2023-11-03 MED ORDER — SULFAMETHOXAZOLE-TRIMETHOPRIM 800-160 MG PO TABS
1.0000 | ORAL_TABLET | Freq: Two times a day (BID) | ORAL | 0 refills | Status: AC
Start: 2023-11-03 — End: 2023-11-13

## 2023-11-03 NOTE — Progress Notes (Signed)
Acute Office Visit   Patient: Jacob Gray   DOB: [birth year]   89 y.o. Male  MRN: [id number] Visit Date: 11/03/2023  Today's healthcare provider: Oswaldo Conroy Gurjot Brisco, PA-C  Introduced myself to the patient as a Secondary school teacher and provided education on APPs in clinical practice.    Chief Complaint  Patient presents with   Recurrent Skin Infections    Boil, in between butt cheeks. X1 week, getting more painful- constant   Subjective    HPI HPI     Recurrent Skin Infections    Additional comments: Boil, in between butt cheeks. X1 week, getting more painful- constant      Last edited by Dollene Primrose, CMA on 11/03/2023  1:05 PM.      Skin concern  Reports concern for skin infection along natal cleft that is becoming more painful  States he thinks it has been there a few days Reports pain with walking, sitting and standing He reports seeing blood on toilet paper this AM - he is not sure if it is draining on it's own He reports last time he had something similar was when he was a child  He reports that it feels like there is a nodular lump in the area of concern  He has tried using OTC abx ointment for the past few days     Medications: Outpatient Medications Prior to Visit  Medication Sig   ASPIRIN 81 PO Take by mouth.   finasteride (PROSCAR) 5 MG tablet    Glucosamine HCl (GLUCOSAMINE PO) Take by mouth 2 (two) times daily.   lactulose (CEPHULAC) 10 g packet Take 1 packet (10 g total) by mouth 2 (two) times daily. Use as needed for severe constipation.   losartan (COZAAR) 25 MG tablet TAKE 1 TABLET (25 MG TOTAL) BY MOUTH DAILY.   Multiple Vitamin (MULTIVITAMIN) tablet Take 1 tablet by mouth daily.   omeprazole (PRILOSEC) 20 MG capsule TAKE 1 CAPSULE BY MOUTH EVERY DAY   No facility-administered medications prior to visit.    Review of Systems  Constitutional:  Negative for chills and fever.  Skin:  Positive for wound.        Objective    BP 134/76   Pulse  67   Resp 16   Ht 6' (1.829 m)   Wt 157 lb (71.2 kg)   SpO2 99%   BMI 21.29 kg/m     Physical Exam Vitals reviewed.  Constitutional:      General: He is awake.     Appearance: Normal appearance. He is well-developed and well-groomed.  HENT:     Head: Normocephalic and atraumatic.  Pulmonary:     Effort: Pulmonary effort is normal.  Skin:    General: Skin is warm and dry.     Comments: Chaperoned by Dollene Primrose, CMA  Abscess along left side of natal cleft, mildly fluctuant but predominantly indurated and inflamed with some crusting present. Approx 3 cm in diameter.   Neurological:     General: No focal deficit present.     Mental Status: He is alert and oriented to person, place, and time.  Psychiatric:        Mood and Affect: Mood normal.        Behavior: Behavior normal. Behavior is cooperative.        Thought Content: Thought content normal.        Judgment: Judgment normal.       No results found  for any visits on 11/03/23.  Assessment & Plan      No follow-ups on file.      Problem List Items Addressed This Visit   None Visit Diagnoses       Cutaneous abscess of buttock    -  Primary   Relevant Medications   sulfamethoxazole-trimethoprim (BACTRIM DS) 800-160 MG tablet      Acute, new concern Patient reports worsening swelling and pain in buttocks region from suspected cyst/boil Physical exam is consistent with indurated cutaneous abscess. Does not appear conducive to incision and drainage at this time given location and current presentation  Will treat with bactrim PO BID x 10 days  Reviewed home care measures with patient and provided instructions in AVS  Reviewed ED and return precautions  Follow up as needed for persistent or progressing symptoms     No follow-ups on file.   I, Cloris Flippo E Buzz Axel, PA-C, have reviewed all documentation for this visit. The documentation on 11/04/23 for the exam, diagnosis, procedures, and orders are all accurate and  complete.   Jacquelin Hawking, MHS, PA-C [facility]

## 2023-11-03 NOTE — Telephone Encounter (Signed)
  Chief Complaint: Boil Symptoms: painful lump between "butt cheeks" Frequency: 1 week Pertinent Negatives: Patient denies current fever, chills Disposition: [] ED /[] Urgent Care (no appt availability in office) / [x] Appointment(In office/virtual)/ []  [facility]/ [] Home Care/ [] Refused Recommended Disposition /[] [facility]/ []  Follow-up with PCP Additional Notes: Pt states he has a boil "between his butt cheeks" it is painful and swollen. No appts in [facility] - appt scheduled at [facility] with Erin Mecum for today.    Summary: boil on bottom   Pt called wanting to see someone today for  boil he has on his bottom.  He said he is in a lot of pain.  CB#  [phone]         Reason for Disposition  [1] Boil > 1/2 inch across (> 12 mm; larger than a marble) AND [2] center is soft or pus colored  Answer Assessment - Initial Assessment Questions 1. APPEARANCE of BOIL: "What does the boil look like?"      Unsure 2. LOCATION: "Where is the boil located?"      Between  3. NUMBER: "How many boils are there?"      1 4. SIZE: "How big is the boil?" (e.g., inches, cm; compare to size of a coin or other object)     Feels like a baseball 5. ONSET: "When did the boil start?"     1 week 6. PAIN: "Is there any pain?" If Yes, ask: "How bad is the pain?"   (Scale 1-10; or mild, moderate, severe)     yes 7. FEVER: "Do you have a fever?" If Yes, ask: "What is it, how was it measured, and when did it start?"      Might have last night  9. OTHER SYMPTOMS: "Do you have any other symptoms?" (e.g., shaking chills, weakness, rash elsewhere on body)     no  Protocols used: Boil (Skin Abscess)-A-AH

## 2023-11-03 NOTE — Telephone Encounter (Signed)
noted 

## 2023-11-03 NOTE — Patient Instructions (Signed)
It appears that you have an abscess near your rectum I have sent in an antibiotic to treat this but there are a few things you can do to help with the discomfort while the antibiotic is kicking in  You can have Sitz baths (sitting with the affected area in a warm bath for about 15-20 minutes. You can add Epsom salts if desired) Keeping the area clean and dry. A gentle soap and warm water should be sufficient for this. Try to avoid using topicals on the area as this could irritate it more.  Avoid squeezing or manipulating the area. Do not try to "pop" the bump as this can cause further complications. If the bump starts to drain on its own you can gently wipe the area as needed to remove the contents but do not try to manipulate it further.   If you start to notice increased pain, severe drainage, fever, chills, redness, streaking along the area, increased swelling'; please let us know or go to the ED for evaluation.   It was nice to meet you and I appreciate the opportunity to be involved in your care If you were satisfied with the care you received from me, I would greatly appreciate you saying so in the after-visit survey that is sent out following our visit.

## 2023-11-14 DIAGNOSIS — H02106 Unspecified ectropion of left eye, unspecified eyelid: Secondary | ICD-10-CM | POA: Diagnosis not present

## 2023-11-14 DIAGNOSIS — H02103 Unspecified ectropion of right eye, unspecified eyelid: Secondary | ICD-10-CM | POA: Diagnosis not present

## 2023-11-14 DIAGNOSIS — H25813 Combined forms of age-related cataract, bilateral: Secondary | ICD-10-CM | POA: Diagnosis not present

## 2024-01-12 ENCOUNTER — Ambulatory Visit: Payer: Medicare PPO | Admitting: Internal Medicine

## 2024-01-12 NOTE — Progress Notes (Deleted)
 Subjective:    Patient ID: Jacob Gray, male    DOB: 1934-09-09, 89 y.o.   MRN: [id number]  HPI  Patient presents to clinic today for 29-month follow-up of chronic conditions.  HLD: His last LDL was 97, triglycerides 782, 06/2023.  He is not taking cholesterol-lowering medication at this time.  He does not consume low-fat diet.  GERD: He is not sure what triggers this.  He has occasional breakthrough on omeprazole for which he takes tums as needed with good relief of symptoms.  There is no upper GI on file.  OA: Generalized.  He is taking glucosamine OTC.  He sees a [facility] regularly.  BPH: Mainly nocturia.  Managed on finasteride.  He does not follow with urology.  HTN: His BP today is 168/82. He is taking losartan as prescribed. There is no ECG on file.  Prediabetes: His last A1c was 5.8%, 06/2023.  He is not taking any oral diabetic medication at this time.  He does not check his sugars.  Review of Systems  No past medical history on file.  Current Outpatient Medications  Medication Sig Dispense Refill   ASPIRIN 81 PO Take by mouth.     finasteride (PROSCAR) 5 MG tablet      Glucosamine HCl (GLUCOSAMINE PO) Take by mouth 2 (two) times daily.     lactulose (CEPHULAC) 10 g packet Take 1 packet (10 g total) by mouth 2 (two) times daily. Use as needed for severe constipation. 15 each 0   losartan (COZAAR) 25 MG tablet TAKE 1 TABLET (25 MG TOTAL) BY MOUTH DAILY. 90 tablet 1   Multiple Vitamin (MULTIVITAMIN) tablet Take 1 tablet by mouth daily.     omeprazole (PRILOSEC) 20 MG capsule TAKE 1 CAPSULE BY MOUTH EVERY DAY 90 capsule 1   No current facility-administered medications for this visit.    No Known Allergies  No family history on file.  Social History   Socioeconomic History   Marital status: Married    Spouse name: Tandre Conly   Number of children: 1   Years of education: Not on file   Highest education level: Associate degree: academic program   Occupational History   Occupation: Product/process development scientist   Occupation: Retired  Tobacco Use   Smoking status: Never   Smokeless tobacco: Current    Types: Snuff  Vaping Use   Vaping status: Never Used  Substance and Sexual Activity   Alcohol use: Yes    Alcohol/week: 0.0 standard drinks of alcohol    Comment: rarely   Drug use: No   Sexual activity: Yes  Other Topics Concern   Not on file  Social History Narrative   Has a living will- would not desire prolonged life support if futile.   Social Drivers of Corporate investment banker Strain: Low Risk  (09/23/2023)   Overall Financial Resource Strain (CARDIA)    Difficulty of Paying Living Expenses: Not hard at all  Food Insecurity: Low Risk  (10/03/2023)   Received from [facility]   Hunger Vital Sign    Worried About Running Out of Food in the Last Year: Never true    Ran Out of Food in the Last Year: Never true  Transportation Needs: No Transportation Needs (10/03/2023)   Received from [facility]    In the past 12 months, has lack of reliable transportation kept you from medical appointments, meetings, work or from getting things needed for daily living? : No  Physical  Activity: Sufficiently Active (09/23/2023)   Exercise Vital Sign    Days of Exercise per Week: 6 days    Minutes of Exercise per Session: 60 min  Stress: No Stress Concern Present (09/23/2023)   [employer] of Occupational Health - Occupational Stress Questionnaire    Feeling of Stress : Not at all  Social Connections: Moderately Integrated (09/23/2023)   Social Connection and Isolation Panel [NHANES]    Frequency of Communication with Friends and Family: More than three times a week    Frequency of Social Gatherings with Friends and Family: More than three times a week    Attends Religious Services: More than 4 times per year    Active Member of [employer] or Organizations: No    Attends Banker Meetings: Never    Marital  Status: Married  Catering manager Violence: Not At Risk (09/23/2023)   Humiliation, Afraid, Rape, and Kick questionnaire    Fear of Current or Ex-Partner: No    Emotionally Abused: No    Physically Abused: No    Sexually Abused: No     Constitutional: Denies fever, malaise, fatigue, headache or abrupt weight changes.  HEENT: Denies eye pain, eye redness, ear pain, ringing in the ears, wax buildup, runny nose, nasal congestion, bloody nose, or sore throat. Respiratory: Denies difficulty breathing, shortness of breath, cough or sputum production.   Cardiovascular: Denies chest pain, chest tightness, palpitations or swelling in the hands or feet.  Gastrointestinal: Denies abdominal pain, bloating, constipation, diarrhea or blood in the stool.  GU: Patient reports nocturia.  Denies urgency, frequency, pain with urination, burning sensation, blood in urine, odor or discharge. Musculoskeletal: Patient reports joint pain.  Denies decrease in range of motion, difficulty with gait, muscle pain or joint swelling.  Skin: Denies redness, rashes, lesions or ulcercations.  Neurological: Denies dizziness, difficulty with memory, difficulty with speech or problems with balance and coordination.  Psych: Denies anxiety, depression, SI/HI.  No other specific complaints in a complete review of systems (except as listed in HPI above).     Objective:   Physical Exam   There were no vitals taken for this visit.  Wt Readings from Last 3 Encounters:  11/03/23 157 lb (71.2 kg)  07/15/23 166 lb (75.3 kg)  04/02/23 165 lb (74.8 kg)    General: Appears his stated age, well developed, well nourished in NAD. Skin: Warm, dry and intact.  HEENT: Head: normal shape and size; Eyes: sclera white, no icterus, conjunctiva pink, PERRLA and EOMs intact;  Cardiovascular: Normal rate and rhythm. S1,S2 noted.  No murmur, rubs or gallops noted. No JVD or BLE edema. No carotid bruits noted. Pulmonary/Chest: Normal effort  and positive vesicular breath sounds. No respiratory distress. No wheezes, rales or ronchi noted.  Abdomen: Soft and nontender. Normal bowel sounds.  Musculoskeletal:  No difficulty with gait.  Neurological: Alert and oriented. Coordination normal.    BMET    Component Value Date/Time   NA 137 07/15/2023 1352   K 5.3 07/15/2023 1352   CL 104 07/15/2023 1352   CO2 29 07/15/2023 1352   GLUCOSE 139 (H) 07/15/2023 1352   BUN 19 07/15/2023 1352   CREATININE 0.91 07/15/2023 1352   CALCIUM 9.8 07/15/2023 1352    Lipid Panel     Component Value Date/Time   CHOL 159 07/15/2023 1352   TRIG 128 07/15/2023 1352   HDL 40 07/15/2023 1352   CHOLHDL 4.0 07/15/2023 1352   VLDL 17.0 07/08/2020 1546   LDLCALC 97  07/15/2023 1352    CBC    Component Value Date/Time   WBC 5.9 07/15/2023 1352   RBC 5.04 07/15/2023 1352   HGB 14.4 07/15/2023 1352   HCT 45.4 07/15/2023 1352   PLT 242 07/15/2023 1352   MCV 90.1 07/15/2023 1352   MCH 28.6 07/15/2023 1352   MCHC 31.7 (L) 07/15/2023 1352   RDW 12.5 07/15/2023 1352   LYMPHSABS 1,140 04/18/2019 1258   MONOABS 0.4 09/27/2016 1425   EOSABS 39 04/18/2019 1258   BASOSABS 31 04/18/2019 1258    Hgb A1C Lab Results  Component Value Date   HGBA1C 5.8 (H) 07/15/2023           Assessment & Plan:   RTC in 6 months for your annual exam Nicki Reaper, NP

## 2024-01-24 ENCOUNTER — Ambulatory Visit: Admitting: Internal Medicine

## 2024-01-24 ENCOUNTER — Encounter: Payer: Self-pay | Admitting: Internal Medicine

## 2024-01-24 VITALS — BP 134/78 | Ht 72.0 in | Wt 174.2 lb

## 2024-01-24 DIAGNOSIS — K219 Gastro-esophageal reflux disease without esophagitis: Secondary | ICD-10-CM

## 2024-01-24 DIAGNOSIS — R7303 Prediabetes: Secondary | ICD-10-CM | POA: Diagnosis not present

## 2024-01-24 DIAGNOSIS — N401 Enlarged prostate with lower urinary tract symptoms: Secondary | ICD-10-CM

## 2024-01-24 DIAGNOSIS — I10 Essential (primary) hypertension: Secondary | ICD-10-CM

## 2024-01-24 DIAGNOSIS — E782 Mixed hyperlipidemia: Secondary | ICD-10-CM | POA: Diagnosis not present

## 2024-01-24 DIAGNOSIS — R351 Nocturia: Secondary | ICD-10-CM

## 2024-01-24 DIAGNOSIS — M159 Polyosteoarthritis, unspecified: Secondary | ICD-10-CM

## 2024-01-24 MED ORDER — LOSARTAN POTASSIUM 25 MG PO TABS: 25.0000 mg | ORAL_TABLET | Freq: Every day | ORAL | 1 refills | Status: DC

## 2024-01-24 NOTE — Assessment & Plan Note (Signed)
Controlled on losartan, refilled today

## 2024-01-24 NOTE — Assessment & Plan Note (Signed)
C-Met and lipid profile today Encouraged him to consume low-fat diet 

## 2024-01-24 NOTE — Assessment & Plan Note (Signed)
 -  Continue finasteride

## 2024-01-24 NOTE — Assessment & Plan Note (Signed)
Try to identify foods that trigger reflux Continue omeprazole and Tums as needed

## 2024-01-24 NOTE — Assessment & Plan Note (Signed)
A1C today Encouraged low carb diet

## 2024-01-24 NOTE — Assessment & Plan Note (Signed)
Continue glucosamine Encourage regular physical activity

## 2024-01-24 NOTE — Patient Instructions (Signed)

## 2024-01-24 NOTE — Progress Notes (Signed)
 Subjective:    Patient ID: Jacob Gray, male    DOB: 1934-10-10, 89 y.o.   MRN: [id number]  HPI  Patient presents to clinic today for 45-month follow-up of chronic conditions.  HLD: His last LDL was 97, triglycerides 093, 06/2023.  He is not taking cholesterol-lowering medication at this time.  He does not consume low-fat diet.  GERD: He is not sure what triggers this.  He has occasional breakthrough on omeprazole for which he takes tums as needed with good relief of symptoms.  There is no upper GI on file.  OA: Generalized.  He is taking glucosamine OTC.  He sees a [facility] regularly.  BPH: Mainly nocturia.  Managed on finasteride.  He does not follow with urology.  HTN: His BP today is 134/78. He is taking losartan as prescribed. There is no ECG on file.  Prediabetes: His last A1c was 5.8%, 06/2023.  He is not taking any oral diabetic medication at this time.  He does not check his sugars.  Review of Systems  No past medical history on file.  Current Outpatient Medications  Medication Sig Dispense Refill   ASPIRIN 81 PO Take by mouth.     finasteride (PROSCAR) 5 MG tablet      Glucosamine HCl (GLUCOSAMINE PO) Take by mouth 2 (two) times daily.     lactulose (CEPHULAC) 10 g packet Take 1 packet (10 g total) by mouth 2 (two) times daily. Use as needed for severe constipation. 15 each 0   losartan (COZAAR) 25 MG tablet TAKE 1 TABLET (25 MG TOTAL) BY MOUTH DAILY. 90 tablet 1   Multiple Vitamin (MULTIVITAMIN) tablet Take 1 tablet by mouth daily.     omeprazole (PRILOSEC) 20 MG capsule TAKE 1 CAPSULE BY MOUTH EVERY DAY 90 capsule 1   No current facility-administered medications for this visit.    No Known Allergies  No family history on file.  Social History   Socioeconomic History   Marital status: Married    Spouse name: Makiah Clauson   Number of children: 1   Years of education: Not on file   Highest education level: Associate degree: academic program   Occupational History   Occupation: Product/process development scientist   Occupation: Retired  Tobacco Use   Smoking status: Never   Smokeless tobacco: Current    Types: Snuff  Vaping Use   Vaping status: Never Used  Substance and Sexual Activity   Alcohol use: Yes    Alcohol/week: 0.0 standard drinks of alcohol    Comment: rarely   Drug use: No   Sexual activity: Yes  Other Topics Concern   Not on file  Social History Narrative   Has a living will- would not desire prolonged life support if futile.   Social Drivers of Corporate investment banker Strain: Low Risk  (09/23/2023)   Overall Financial Resource Strain (CARDIA)    Difficulty of Paying Living Expenses: Not hard at all  Food Insecurity: Low Risk  (10/03/2023)   Received from [facility]   Hunger Vital Sign    Worried About Running Out of Food in the Last Year: Never true    Ran Out of Food in the Last Year: Never true  Transportation Needs: No Transportation Needs (10/03/2023)   Received from [facility]    In the past 12 months, has lack of reliable transportation kept you from medical appointments, meetings, work or from getting things needed for daily living? : No  Physical  Activity: Sufficiently Active (09/23/2023)   Exercise Vital Sign    Days of Exercise per Week: 6 days    Minutes of Exercise per Session: 60 min  Stress: No Stress Concern Present (09/23/2023)   [employer] of Occupational Health - Occupational Stress Questionnaire    Feeling of Stress : Not at all  Social Connections: Moderately Integrated (09/23/2023)   Social Connection and Isolation Panel [NHANES]    Frequency of Communication with Friends and Family: More than three times a week    Frequency of Social Gatherings with Friends and Family: More than three times a week    Attends Religious Services: More than 4 times per year    Active Member of [employer] or Organizations: No    Attends Banker Meetings: Never    Marital  Status: Married  Catering manager Violence: Not At Risk (09/23/2023)   Humiliation, Afraid, Rape, and Kick questionnaire    Fear of Current or Ex-Partner: No    Emotionally Abused: No    Physically Abused: No    Sexually Abused: No     Constitutional: Denies fever, malaise, fatigue, headache or abrupt weight changes.  HEENT: Denies eye pain, eye redness, ear pain, ringing in the ears, wax buildup, runny nose, nasal congestion, bloody nose, or sore throat. Respiratory: Denies difficulty breathing, shortness of breath, cough or sputum production.   Cardiovascular: Denies chest pain, chest tightness, palpitations or swelling in the hands or feet.  Gastrointestinal: Denies abdominal pain, bloating, constipation, diarrhea or blood in the stool.  GU: Patient reports nocturia.  Denies urgency, frequency, pain with urination, burning sensation, blood in urine, odor or discharge. Musculoskeletal: Patient reports joint pain.  Denies decrease in range of motion, difficulty with gait, muscle pain or joint swelling.  Skin: Denies redness, rashes, lesions or ulcercations.  Neurological: Patient reports paresthesia of feet.  Denies dizziness, difficulty with memory, difficulty with speech or problems with balance and coordination.  Psych: Denies anxiety, depression, SI/HI.  No other specific complaints in a complete review of systems (except as listed in HPI above).     Objective:   Physical Exam  BP 134/78 (BP Location: Left Arm, Patient Position: Sitting, Cuff Size: Normal)   Ht 6' (1.829 m)   Wt 174 lb 3.2 oz (79 kg)   BMI 23.63 kg/m    Wt Readings from Last 3 Encounters:  11/03/23 157 lb (71.2 kg)  07/15/23 166 lb (75.3 kg)  04/02/23 165 lb (74.8 kg)    General: Appears his stated age, well developed, well nourished in NAD. Skin: Warm, dry and intact.  HEENT: Head: normal shape and size; Eyes: sclera white, no icterus, conjunctiva pink, PERRLA and EOMs intact;  Cardiovascular:  Bradycardic with normal rhythm. S1,S2 noted.  Murmur noted. No JVD or BLE edema. No carotid bruits noted. Pulmonary/Chest: Normal effort and positive vesicular breath sounds. No respiratory distress. No wheezes, rales or ronchi noted.  Abdomen: Soft and nontender. Normal bowel sounds.  Musculoskeletal:  No difficulty with gait.  Neurological: Alert and oriented. Coordination normal.    BMET    Component Value Date/Time   NA 137 07/15/2023 1352   K 5.3 07/15/2023 1352   CL 104 07/15/2023 1352   CO2 29 07/15/2023 1352   GLUCOSE 139 (H) 07/15/2023 1352   BUN 19 07/15/2023 1352   CREATININE 0.91 07/15/2023 1352   CALCIUM 9.8 07/15/2023 1352    Lipid Panel     Component Value Date/Time   CHOL 159 07/15/2023 1352  TRIG 128 07/15/2023 1352   HDL 40 07/15/2023 1352   CHOLHDL 4.0 07/15/2023 1352   VLDL 17.0 07/08/2020 1546   LDLCALC 97 07/15/2023 1352    CBC    Component Value Date/Time   WBC 5.9 07/15/2023 1352   RBC 5.04 07/15/2023 1352   HGB 14.4 07/15/2023 1352   HCT 45.4 07/15/2023 1352   PLT 242 07/15/2023 1352   MCV 90.1 07/15/2023 1352   MCH 28.6 07/15/2023 1352   MCHC 31.7 (L) 07/15/2023 1352   RDW 12.5 07/15/2023 1352   LYMPHSABS 1,140 04/18/2019 1258   MONOABS 0.4 09/27/2016 1425   EOSABS 39 04/18/2019 1258   BASOSABS 31 04/18/2019 1258    Hgb A1C Lab Results  Component Value Date   HGBA1C 5.8 (H) 07/15/2023           Assessment & Plan:   RTC in 6 months for your annual exam Nicki Reaper, NP

## 2024-01-25 ENCOUNTER — Encounter: Payer: Self-pay | Admitting: Internal Medicine

## 2024-01-25 LAB — HEMOGLOBIN A1C
Hgb A1c MFr Bld: 5.7 %{Hb} — ABNORMAL HIGH (ref <5.7)
Mean Plasma Glucose: 117 mg/dL
eAG (mmol/L): 6.5 mmol/L

## 2024-01-25 LAB — CBC
HCT: 46.7 % (ref 38.5–50.0)
Hemoglobin: 14.9 g/dL (ref 13.2–17.1)
MCH: 27.7 pg (ref 27.0–33.0)
MCHC: 31.9 g/dL — ABNORMAL LOW (ref 32.0–36.0)
MCV: 87 fL (ref 80.0–100.0)
MPV: 11.4 fL (ref 7.5–12.5)
Platelets: 253 10*3/uL (ref 140–400)
RBC: 5.37 10*6/uL (ref 4.20–5.80)
RDW: 13 % (ref 11.0–15.0)
WBC: 5.7 10*3/uL (ref 3.8–10.8)

## 2024-01-25 LAB — COMPREHENSIVE METABOLIC PANEL WITH GFR
AG Ratio: 1.8 (calc) (ref 1.0–2.5)
ALT: 13 U/L (ref 9–46)
AST: 15 U/L (ref 10–35)
Albumin: 4.2 g/dL (ref 3.6–5.1)
Alkaline phosphatase (APISO): 71 U/L (ref 35–144)
BUN: 21 mg/dL (ref 7–25)
CO2: 27 mmol/L (ref 20–32)
Calcium: 9.3 mg/dL (ref 8.6–10.3)
Chloride: 104 mmol/L (ref 98–110)
Creat: 0.93 mg/dL (ref 0.70–1.22)
Globulin: 2.4 g/dL (ref 1.9–3.7)
Glucose, Bld: 106 mg/dL (ref 65–139)
Potassium: 4.8 mmol/L (ref 3.5–5.3)
Sodium: 139 mmol/L (ref 135–146)
Total Bilirubin: 0.5 mg/dL (ref 0.2–1.2)
Total Protein: 6.6 g/dL (ref 6.1–8.1)
eGFR: 79 mL/min/{1.73_m2} (ref >=60)

## 2024-01-25 LAB — LIPID PANEL
Cholesterol: 168 mg/dL (ref <200)
HDL: 45 mg/dL (ref >=40)
LDL Cholesterol (Calc): 103 mg/dL — ABNORMAL HIGH
Non-HDL Cholesterol (Calc): 123 mg/dL (ref <130)
Total CHOL/HDL Ratio: 3.7 (calc) (ref <5.0)
Triglycerides: 105 mg/dL (ref <150)

## 2024-02-22 ENCOUNTER — Other Ambulatory Visit: Payer: Self-pay | Admitting: Internal Medicine

## 2024-05-18 DIAGNOSIS — H02103 Unspecified ectropion of right eye, unspecified eyelid: Secondary | ICD-10-CM | POA: Diagnosis not present

## 2024-05-18 DIAGNOSIS — Z9842 Cataract extraction status, left eye: Secondary | ICD-10-CM | POA: Diagnosis not present

## 2024-05-18 DIAGNOSIS — Z9841 Cataract extraction status, right eye: Secondary | ICD-10-CM | POA: Diagnosis not present

## 2024-05-18 DIAGNOSIS — Z961 Presence of intraocular lens: Secondary | ICD-10-CM | POA: Diagnosis not present

## 2024-05-18 DIAGNOSIS — H02106 Unspecified ectropion of left eye, unspecified eyelid: Secondary | ICD-10-CM | POA: Diagnosis not present

## 2024-07-17 ENCOUNTER — Ambulatory Visit (INDEPENDENT_AMBULATORY_CARE_PROVIDER_SITE_OTHER): Admitting: Internal Medicine

## 2024-07-17 ENCOUNTER — Encounter: Payer: Self-pay | Admitting: Internal Medicine

## 2024-07-17 VITALS — BP 144/84 | Ht 72.0 in | Wt 174.6 lb

## 2024-07-17 DIAGNOSIS — Z125 Encounter for screening for malignant neoplasm of prostate: Secondary | ICD-10-CM | POA: Diagnosis not present

## 2024-07-17 DIAGNOSIS — E782 Mixed hyperlipidemia: Secondary | ICD-10-CM | POA: Diagnosis not present

## 2024-07-17 DIAGNOSIS — R7303 Prediabetes: Secondary | ICD-10-CM | POA: Diagnosis not present

## 2024-07-17 DIAGNOSIS — Z0001 Encounter for general adult medical examination with abnormal findings: Secondary | ICD-10-CM | POA: Diagnosis not present

## 2024-07-17 DIAGNOSIS — Z23 Encounter for immunization: Secondary | ICD-10-CM | POA: Diagnosis not present

## 2024-07-17 NOTE — Patient Instructions (Signed)
 Health Maintenance, Male  Adopting a healthy lifestyle and getting preventive care are important in promoting health and wellness. Ask your health care provider about:  The right schedule for you to have regular tests and exams.  Things you can do on your own to prevent diseases and keep yourself healthy.  What should I know about diet, weight, and exercise?  Eat a healthy diet    Eat a diet that includes plenty of vegetables, fruits, low-fat dairy products, and lean protein.  Do not eat a lot of foods that are high in solid fats, added sugars, or sodium.  Maintain a healthy weight  Body mass index (BMI) is a measurement that can be used to identify possible weight problems. It estimates body fat based on height and weight. Your health care provider can help determine your BMI and help you achieve or maintain a healthy weight.  Get regular exercise  Get regular exercise. This is one of the most important things you can do for your health. Most adults should:  Exercise for at least 150 minutes each week. The exercise should increase your heart rate and make you sweat (moderate-intensity exercise).  Do strengthening exercises at least twice a week. This is in addition to the moderate-intensity exercise.  Spend less time sitting. Even light physical activity can be beneficial.  Watch cholesterol and blood lipids  Have your blood tested for lipids and cholesterol at 88 years of age, then have this test every 5 years.  You may need to have your cholesterol levels checked more often if:  Your lipid or cholesterol levels are high.  You are older than 88 years of age.  You are at high risk for heart disease.  What should I know about cancer screening?  Many types of cancers can be detected early and may often be prevented. Depending on your health history and family history, you may need to have cancer screening at various ages. This may include screening for:  Colorectal cancer.  Prostate cancer.  Skin cancer.  Lung  cancer.  What should I know about heart disease, diabetes, and high blood pressure?  Blood pressure and heart disease  High blood pressure causes heart disease and increases the risk of stroke. This is more likely to develop in people who have high blood pressure readings or are overweight.  Talk with your health care provider about your target blood pressure readings.  Have your blood pressure checked:  Every 3-5 years if you are 24-52 years of age.  Every year if you are 3 years old or older.  If you are between the ages of 60 and 72 and are a current or former smoker, ask your health care provider if you should have a one-time screening for abdominal aortic aneurysm (AAA).  Diabetes  Have regular diabetes screenings. This checks your fasting blood sugar level. Have the screening done:  Once every three years after age 66 if you are at a normal weight and have a low risk for diabetes.  More often and at a younger age if you are overweight or have a high risk for diabetes.  What should I know about preventing infection?  Hepatitis B  If you have a higher risk for hepatitis B, you should be screened for this virus. Talk with your health care provider to find out if you are at risk for hepatitis B infection.  Hepatitis C  Blood testing is recommended for:  Everyone born from [birth year] through 1963.  Anyone  with known risk factors for hepatitis C.  Sexually transmitted infections (STIs)  You should be screened each year for STIs, including gonorrhea and chlamydia, if:  You are sexually active and are younger than 88 years of age.  You are older than 89 years of age and your health care provider tells you that you are at risk for this type of infection.  Your sexual activity has changed since you were last screened, and you are at increased risk for chlamydia or gonorrhea. Ask your health care provider if you are at risk.  Ask your health care provider about whether you are at high risk for HIV. Your health care provider  may recommend a prescription medicine to help prevent HIV infection. If you choose to take medicine to prevent HIV, you should first get tested for HIV. You should then be tested every 3 months for as long as you are taking the medicine.  Follow these instructions at home:  Alcohol use  Do not drink alcohol if your health care provider tells you not to drink.  If you drink alcohol:  Limit how much you have to 0-2 drinks a day.  Know how much alcohol is in your drink. In the [place] one drink equals one 12 oz bottle of beer (355 mL), one 5 oz glass of wine (148 mL), or one 1 oz glass of hard liquor (44 mL).  Lifestyle  Do not use any products that contain nicotine or tobacco. These products include cigarettes, chewing tobacco, and vaping devices, such as e-cigarettes. If you need help quitting, ask your health care provider.  Do not use street drugs.  Do not share needles.  Ask your health care provider for help if you need support or information about quitting drugs.  General instructions  Schedule regular health, dental, and eye exams.  Stay current with your vaccines.  Tell your health care provider if:  You often feel depressed.  You have ever been abused or do not feel safe at home.  Summary  Adopting a healthy lifestyle and getting preventive care are important in promoting health and wellness.  Follow your health care provider's instructions about healthy diet, exercising, and getting tested or screened for diseases.  Follow your health care provider's instructions on monitoring your cholesterol and blood pressure.  This information is not intended to replace advice given to you by your health care provider. Make sure you discuss any questions you have with your health care provider.  Document Revised: 02/23/2021 Document Reviewed: 02/23/2021  Elsevier Patient Education  2024 [employer].

## 2024-07-17 NOTE — Progress Notes (Signed)
 Subjective:    Patient ID: Jacob Gray, male    DOB: August 03, 1934, 89 y.o.   MRN: [id number]  HPI  Patient presents to clinic today for his annual exam.  Flu: 06/2023 Tetanus: 06/2016 COVID: X 3 Pneumovax: 07/2009 Prevnar 20: 06/2023 Shingrix: 07/2019, 09/2019 PSA screening: 06/2023 Vision screening: annually Dentist: as needed  Diet: He does eat meat. He consumes fruits and veggies. He does eat fried foods. He drinks mostly water, coffee, milk Exercise: golfing   Review of Systems     No past medical history on file.  Current Outpatient Medications  Medication Sig Dispense Refill   ASPIRIN 81 PO Take by mouth.     finasteride (PROSCAR) 5 MG tablet      Glucosamine HCl (GLUCOSAMINE PO) Take by mouth 2 (two) times daily.     lactulose  (CEPHULAC ) 10 g packet Take 1 packet (10 g total) by mouth 2 (two) times daily. Use as needed for severe constipation. 15 each 0   losartan  (COZAAR ) 25 MG tablet Take 1 tablet (25 mg total) by mouth daily. 90 tablet 1   Multiple Vitamin (MULTIVITAMIN) tablet Take 1 tablet by mouth daily.     omeprazole  (PRILOSEC) 20 MG capsule TAKE 1 CAPSULE BY MOUTH EVERY DAY 90 capsule 1   No current facility-administered medications for this visit.    No Known Allergies  No family history on file.  Social History   Socioeconomic History   Marital status: Married    Spouse name: Corin Tilly   Number of children: 1   Years of education: Not on file   Highest education level: Associate degree: academic program  Occupational History   Occupation: Product/process development scientist   Occupation: Retired  Tobacco Use   Smoking status: Never   Smokeless tobacco: Current    Types: Snuff  Vaping Use   Vaping status: Never Used  Substance and Sexual Activity   Alcohol use: Yes    Alcohol/week: 0.0 standard drinks of alcohol    Comment: rarely   Drug use: No   Sexual activity: Yes  Other Topics Concern   Not on file  Social History Narrative   Has a  living will- would not desire prolonged life support if futile.   Social Drivers of Corporate investment banker Strain: Low Risk  (09/23/2023)   Overall Financial Resource Strain (CARDIA)    Difficulty of Paying Living Expenses: Not hard at all  Food Insecurity: Low Risk  (10/03/2023)   Received from [facility]   Hunger Vital Sign    Worried About Running Out of Food in the Last Year: Never true    Ran Out of Food in the Last Year: Never true  Transportation Needs: No Transportation Needs (10/03/2023)   Received from [facility]    In the past 12 months, has lack of reliable transportation kept you from medical appointments, meetings, work or from getting things needed for daily living? : No  Physical Activity: Sufficiently Active (09/23/2023)   Exercise Vital Sign    Days of Exercise per Week: 6 days    Minutes of Exercise per Session: 60 min  Stress: No Stress Concern Present (09/23/2023)   [employer] of Occupational Health - Occupational Stress Questionnaire    Feeling of Stress : Not at all  Social Connections: Moderately Integrated (09/23/2023)   Social Connection and Isolation Panel    Frequency of Communication with Friends and Family: More than three times a week  Frequency of Social Gatherings with Friends and Family: More than three times a week    Attends Religious Services: More than 4 times per year    Active Member of [employer] or Organizations: No    Attends Banker Meetings: Never    Marital Status: Married  Catering manager Violence: Not At Risk (09/23/2023)   Humiliation, Afraid, Rape, and Kick questionnaire    Fear of Current or Ex-Partner: No    Emotionally Abused: No    Physically Abused: No    Sexually Abused: No     Constitutional: Denies fever, malaise, fatigue, headache or abrupt weight changes.  HEENT: Denies eye pain, eye redness, ear pain, ringing in the ears, wax buildup, runny nose, nasal congestion, bloody  nose, or sore throat. Respiratory: Denies difficulty breathing, shortness of breath, cough or sputum production.   Cardiovascular: Denies chest pain, chest tightness, palpitations or swelling in the hands or feet.  Gastrointestinal: Denies abdominal pain, bloating, constipation, diarrhea or blood in the stool.  GU: Denies urgency, frequency, pain with urination, burning sensation, blood in urine, odor or discharge. Musculoskeletal: Patient reports joint pain.  Denies decrease in range of motion, difficulty with gait, muscle pain or joint swelling.  Skin: Denies redness, rashes, lesions or ulcercations.  Neurological: Denies dizziness, difficulty with memory, difficulty with speech or problems with balance and coordination.  Psych: Denies anxiety, depression, SI/HI.  No other specific complaints in a complete review of systems (except as listed in HPI above).  Objective:   Physical Exam  BP (!) 144/84   Ht 6' (1.829 m)   Wt 174 lb 9.6 oz (79.2 kg)   BMI 23.68 kg/m    Wt Readings from Last 3 Encounters:  01/24/24 174 lb 3.2 oz (79 kg)  11/03/23 157 lb (71.2 kg)  07/15/23 166 lb (75.3 kg)    General: Appears his stated age, well developed, well nourished in NAD. Skin: Warm, dry and intact.  HEENT: Head: normal shape and size; Eyes: sclera white, no icterus, conjunctiva pink, PERRLA and EOMs intact;  Neck:  Neck supple, trachea midline. No masses, lumps or thyromegaly present.  Cardiovascular: Bradycardic with normal rhythm. S1,S2 noted.  No murmur, rubs or gallops noted. No JVD or BLE edema. No carotid bruits noted. Pulmonary/Chest: Normal effort and positive vesicular breath sounds. No respiratory distress. No wheezes, rales or ronchi noted.  Abdomen: Soft and nontender. Normal bowel sounds.  Musculoskeletal: Strength 5/5 BUE/BLE. No difficulty with gait.  Neurological: Alert and oriented. Cranial nerves II-XII grossly intact. Coordination normal.  Psychiatric: Mood and affect  normal. Behavior is normal. Judgment and thought content normal.    BMET    Component Value Date/Time   NA 139 01/24/2024 1520   K 4.8 01/24/2024 1520   CL 104 01/24/2024 1520   CO2 27 01/24/2024 1520   GLUCOSE 106 01/24/2024 1520   BUN 21 01/24/2024 1520   CREATININE 0.93 01/24/2024 1520   CALCIUM 9.3 01/24/2024 1520    Lipid Panel     Component Value Date/Time   CHOL 168 01/24/2024 1520   TRIG 105 01/24/2024 1520   HDL 45 01/24/2024 1520   CHOLHDL 3.7 01/24/2024 1520   VLDL 17.0 07/08/2020 1546   LDLCALC 103 (H) 01/24/2024 1520    CBC    Component Value Date/Time   WBC 5.7 01/24/2024 1520   RBC 5.37 01/24/2024 1520   HGB 14.9 01/24/2024 1520   HCT 46.7 01/24/2024 1520   PLT 253 01/24/2024 1520   MCV  87.0 01/24/2024 1520   MCH 27.7 01/24/2024 1520   MCHC 31.9 (L) 01/24/2024 1520   RDW 13.0 01/24/2024 1520   LYMPHSABS 1,140 04/18/2019 1258   MONOABS 0.4 09/27/2016 1425   EOSABS 39 04/18/2019 1258   BASOSABS 31 04/18/2019 1258    Hgb A1C Lab Results  Component Value Date   HGBA1C 5.7 (H) 01/24/2024          Assessment & Plan:   Preventative health maintenance:  Flu shot today Tetanus UTD Encouraged him to get his COVID booster Pneumovax and prevnar UTD Shingrix UTD He no longer needs to screen for colon cancer Encouraged him to consume a balanced diet and exercise regimen Advised him to see an eye doctor and dentist annually We will check CBC, c-Met, lipid, A1c and PSA today  RTC in 6 months, follow-up chronic conditions Angeline Laura, NP

## 2024-07-18 ENCOUNTER — Ambulatory Visit: Payer: Self-pay | Admitting: Internal Medicine

## 2024-07-18 LAB — COMPREHENSIVE METABOLIC PANEL WITH GFR
AG Ratio: 1.7 (calc) (ref 1.0–2.5)
ALT: 17 U/L (ref 9–46)
AST: 17 U/L (ref 10–35)
Albumin: 4.2 g/dL (ref 3.6–5.1)
Alkaline phosphatase (APISO): 82 U/L (ref 35–144)
BUN: 19 mg/dL (ref 7–25)
CO2: 28 mmol/L (ref 20–32)
Calcium: 9.7 mg/dL (ref 8.6–10.3)
Chloride: 106 mmol/L (ref 98–110)
Creat: 1.07 mg/dL (ref 0.70–1.22)
Globulin: 2.5 g/dL (ref 1.9–3.7)
Glucose, Bld: 96 mg/dL (ref 65–99)
Potassium: 5.6 mmol/L — ABNORMAL HIGH (ref 3.5–5.3)
Sodium: 143 mmol/L (ref 135–146)
Total Bilirubin: 0.6 mg/dL (ref 0.2–1.2)
Total Protein: 6.7 g/dL (ref 6.1–8.1)
eGFR: 67 mL/min/1.73m2 (ref >=60)

## 2024-07-18 LAB — CBC
HCT: 44 % (ref 38.5–50.0)
Hemoglobin: 14.4 g/dL (ref 13.2–17.1)
MCH: 28.7 pg (ref 27.0–33.0)
MCHC: 32.7 g/dL (ref 32.0–36.0)
MCV: 87.6 fL (ref 80.0–100.0)
MPV: 11.2 fL (ref 7.5–12.5)
Platelets: 240 Thousand/uL (ref 140–400)
RBC: 5.02 Million/uL (ref 4.20–5.80)
RDW: 12.8 % (ref 11.0–15.0)
WBC: 6 Thousand/uL (ref 3.8–10.8)

## 2024-07-18 LAB — LIPID PANEL
Cholesterol: 161 mg/dL (ref <200)
HDL: 39 mg/dL — ABNORMAL LOW (ref >=40)
LDL Cholesterol (Calc): 104 mg/dL — ABNORMAL HIGH
Non-HDL Cholesterol (Calc): 122 mg/dL (ref <130)
Total CHOL/HDL Ratio: 4.1 (calc) (ref <5.0)
Triglycerides: 90 mg/dL (ref <150)

## 2024-07-18 LAB — HEMOGLOBIN A1C
Hgb A1c MFr Bld: 5.6 % (ref <5.7)
Mean Plasma Glucose: 114 mg/dL
eAG (mmol/L): 6.3 mmol/L

## 2024-07-18 LAB — PSA: PSA: 1.92 ng/mL (ref <=4.00)

## 2024-07-26 ENCOUNTER — Other Ambulatory Visit: Payer: Self-pay | Admitting: Internal Medicine

## 2024-07-27 NOTE — Telephone Encounter (Signed)
 Requested Prescriptions  Pending Prescriptions Disp Refills   losartan  (COZAAR ) 25 MG tablet [Pharmacy Med Name: LOSARTAN  POTASSIUM 25 MG TAB] 90 tablet 1    Sig: TAKE 1 TABLET (25 MG TOTAL) BY MOUTH DAILY.     Cardiovascular:  Angiotensin Receptor Blockers Failed - 07/27/2024  3:00 PM      Failed - K in normal range and within 180 days    Potassium  Date Value Ref Range Status  07/17/2024 5.6 (H) 3.5 - 5.3 mmol/L Final         Failed - Last BP in normal range    BP Readings from Last 1 Encounters:  07/17/24 (!) 144/84         Passed - Cr in normal range and within 180 days    Creat  Date Value Ref Range Status  07/17/2024 1.07 0.70 - 1.22 mg/dL Final         Passed - Patient is not pregnant      Passed - Valid encounter within last 6 months    Recent Outpatient Visits           1 week ago Encounter for general adult medical examination with abnormal findings   [facility] [place], Angeline ORN, NP   6 months ago Mixed hyperlipidemia   [facility] [place], Angeline ORN, [place]

## 2024-08-19 ENCOUNTER — Other Ambulatory Visit: Payer: Self-pay | Admitting: Internal Medicine

## 2024-08-21 NOTE — Telephone Encounter (Signed)
 Requested Prescriptions  Pending Prescriptions Disp Refills   omeprazole  (PRILOSEC) 20 MG capsule [Pharmacy Med Name: OMEPRAZOLE  DR 20 MG CAPSULE] 90 capsule 1    Sig: TAKE 1 CAPSULE BY MOUTH EVERY DAY     Gastroenterology: Proton Pump Inhibitors Passed - 08/21/2024 11:51 AM      Passed - Valid encounter within last 12 months    Recent Outpatient Visits           1 month ago Encounter for general adult medical examination with abnormal findings   [place] [facility] [place], Angeline ORN, NP   7 months ago Mixed hyperlipidemia   [facility] [place], Angeline ORN, [place]

## 2024-10-25 ENCOUNTER — Encounter: Payer: Self-pay | Admitting: Internal Medicine

## 2024-10-25 ENCOUNTER — Ambulatory Visit: Admitting: Internal Medicine

## 2024-10-25 VITALS — BP 150/90 | Ht 72.0 in | Wt 178.0 lb

## 2024-10-25 DIAGNOSIS — E875 Hyperkalemia: Secondary | ICD-10-CM | POA: Diagnosis not present

## 2024-10-25 DIAGNOSIS — I10 Essential (primary) hypertension: Secondary | ICD-10-CM | POA: Diagnosis not present

## 2024-10-25 DIAGNOSIS — F4321 Adjustment disorder with depressed mood: Secondary | ICD-10-CM | POA: Diagnosis not present

## 2024-10-25 NOTE — Progress Notes (Signed)
 "  Subjective:    Patient ID: Jacob Gray, male    DOB: 21-Mar-1934, 89 y.o.   MRN: [id number]  HPI  Patient presents to clinic today for follow-up of HTN.  His BP today is 154/80.  He is taking losartan  25 mg daily.  His last potassium was 5.6%.  He was supposed to return in October for follow-up of HTN and potassium however this appointment was never made.  He feels like his blood pressure is elevated today because he recently lost his wife about 3 weeks ago.   Review of Systems     No past medical history on file.  Current Outpatient Medications  Medication Sig Dispense Refill   ASPIRIN 81 PO Take by mouth.     finasteride (PROSCAR) 5 MG tablet      Glucosamine HCl (GLUCOSAMINE PO) Take by mouth 2 (two) times daily.     lactulose  (CEPHULAC ) 10 g packet Take 1 packet (10 g total) by mouth 2 (two) times daily. Use as needed for severe constipation. 15 each 0   losartan  (COZAAR ) 25 MG tablet TAKE 1 TABLET (25 MG TOTAL) BY MOUTH DAILY. 90 tablet 1   Multiple Vitamin (MULTIVITAMIN) tablet Take 1 tablet by mouth daily.     omeprazole  (PRILOSEC) 20 MG capsule TAKE 1 CAPSULE BY MOUTH EVERY DAY 90 capsule 1   No current facility-administered medications for this visit.    No Known Allergies  No family history on file.  Social History   Socioeconomic History   Marital status: Married    Spouse name: Milburn Freeney   Number of children: 1   Years of education: Not on file   Highest education level: Associate degree: academic program  Occupational History   Occupation: Product/process Development Scientist   Occupation: Retired  Tobacco Use   Smoking status: Never   Smokeless tobacco: Current    Types: Snuff  Vaping Use   Vaping status: Never Used  Substance and Sexual Activity   Alcohol use: Yes    Alcohol/week: 0.0 standard drinks of alcohol    Comment: rarely   Drug use: No   Sexual activity: Yes  Other Topics Concern   Not on file  Social History Narrative   Has a living will-  would not desire prolonged life support if futile.   Social Drivers of Health   Tobacco Use: High Risk (07/17/2024)   Patient History    Smoking Tobacco Use: Never    Smokeless Tobacco Use: Current    Passive Exposure: Not on file  Financial Resource Strain: Low Risk (09/23/2023)   Overall Financial Resource Strain (CARDIA)    Difficulty of Paying Living Expenses: Not hard at all  Food Insecurity: Low Risk (10/03/2023)   Received from [facility]   Epic    Within the past 12 months, you worried that your food would run out before you got money to buy more: Never true    Within the past 12 months, the food you bought just didn't last and you didn't have money to get more. : Never true  Transportation Needs: No Transportation Needs (10/03/2023)   Received from [facility]    In the past 12 months, has lack of reliable transportation kept you from medical appointments, meetings, work or from getting things needed for daily living? : No  Physical Activity: Sufficiently Active (09/23/2023)   Exercise Vital Sign    Days of Exercise per Week: 6 days    Minutes of Exercise  per Session: 60 min  Stress: No Stress Concern Present (09/23/2023)   [employer] of Occupational Health - Occupational Stress Questionnaire    Feeling of Stress : Not at all  Social Connections: Moderately Integrated (09/23/2023)   Social Connection and Isolation Panel    Frequency of Communication with Friends and Family: More than three times a week    Frequency of Social Gatherings with Friends and Family: More than three times a week    Attends Religious Services: More than 4 times per year    Active Member of [employer] or Organizations: No    Attends Banker Meetings: Never    Marital Status: Married  Catering Manager Violence: Not At Risk (09/23/2023)   Humiliation, Afraid, Rape, and Kick questionnaire    Fear of Current or Ex-Partner: No    Emotionally Abused: No    Physically  Abused: No    Sexually Abused: No  Depression (PHQ2-9): Low Risk (01/24/2024)   Depression (PHQ2-9)    PHQ-2 Score: 0  Alcohol Screen: Low Risk (09/23/2023)   Alcohol Screen    Last Alcohol Screening Score (AUDIT): 4  Housing: Low Risk (10/03/2023)   Received from [facility]   Epic    What is your living situation today?: I have a steady place to live    Think about the place you live. Do you have problems with any of the following? Choose all that apply:: None/None on this list  Utilities: Low Risk (10/03/2023)   Received from [facility]   Utilities    In the past 12 months has the electric, gas, oil, or water company threatened to shut off services in your home? : No  Health Literacy: Adequate Health Literacy (09/23/2023)   B1300 Health Literacy    Frequency of need for help with medical instructions: Never     Constitutional: Denies fever, malaise, fatigue, headache or abrupt weight changes.  HEENT: Denies eye pain, eye redness, ear pain, ringing in the ears, wax buildup, runny nose, nasal congestion, bloody nose, or sore throat. Respiratory: Denies difficulty breathing, shortness of breath, cough or sputum production.   Cardiovascular: Denies chest pain, chest tightness, palpitations or swelling in the hands or feet.  Gastrointestinal: Denies abdominal pain, bloating, constipation, diarrhea or blood in the stool.  GU: Denies urgency, frequency, pain with urination, burning sensation, blood in urine, odor or discharge. Musculoskeletal: Patient reports joint pain.  Denies decrease in range of motion, difficulty with gait, muscle pain or joint swelling.  Skin: Denies redness, rashes, lesions or ulcercations.  Neurological: Denies dizziness, difficulty with memory, difficulty with speech or problems with balance and coordination.  Psych: Patient reports grief.  Denies anxiety, depression, SI/HI.  No other specific complaints in a complete review of systems (except as listed in HPI  above).  Objective:   Physical Exam  BP (!) 154/80 (BP Location: Left Arm, Patient Position: Sitting, Cuff Size: Normal)   Ht 6' (1.829 m)   Wt 178 lb (80.7 kg)   BMI 24.14 kg/m     Wt Readings from Last 3 Encounters:  07/17/24 174 lb 9.6 oz (79.2 kg)  01/24/24 174 lb 3.2 oz (79 kg)  11/03/23 157 lb (71.2 kg)    General: Appears his stated age, well developed, well nourished in NAD. Skin: Warm, dry and intact.  HEENT: Head: normal shape and size; Eyes: sclera white, no icterus, conjunctiva pink, PERRLA and EOMs intact;  Cardiovascular: Bradycardic with normal rhythm. S1,S2 noted.  No murmur, rubs or  gallops noted. No JVD or BLE edema.  Pulmonary/Chest: Normal effort and positive vesicular breath sounds. No respiratory distress. No wheezes, rales or ronchi noted.  Musculoskeletal:  No difficulty with gait.  Neurological: Alert and oriented. Coordination normal.  Psychiatric: Mood and affect normal. Behavior is normal. Judgment and thought content normal.    BMET    Component Value Date/Time   NA 143 07/17/2024 1530   K 5.6 (H) 07/17/2024 1530   CL 106 07/17/2024 1530   CO2 28 07/17/2024 1530   GLUCOSE 96 07/17/2024 1530   BUN 19 07/17/2024 1530   CREATININE 1.07 07/17/2024 1530   CALCIUM 9.7 07/17/2024 1530    Lipid Panel     Component Value Date/Time   CHOL 161 07/17/2024 1530   TRIG 90 07/17/2024 1530   HDL 39 (L) 07/17/2024 1530   CHOLHDL 4.1 07/17/2024 1530   VLDL 17.0 07/08/2020 1546   LDLCALC 104 (H) 07/17/2024 1530    CBC    Component Value Date/Time   WBC 6.0 07/17/2024 1530   RBC 5.02 07/17/2024 1530   HGB 14.4 07/17/2024 1530   HCT 44.0 07/17/2024 1530   PLT 240 07/17/2024 1530   MCV 87.6 07/17/2024 1530   MCH 28.7 07/17/2024 1530   MCHC 32.7 07/17/2024 1530   RDW 12.8 07/17/2024 1530   LYMPHSABS 1,140 04/18/2019 1258   MONOABS 0.4 09/27/2016 1425   EOSABS 39 04/18/2019 1258   BASOSABS 31 04/18/2019 1258    Hgb A1C Lab Results   Component Value Date   HGBA1C 5.6 07/17/2024          Assessment & Plan:   Hyperkalemia:  Will repeat potassium level today  Grief:  Secondary to the loss of his wife He is not interested in grief counseling at this time  RTC in 2 weeks, follow-up HTN 3 months, follow-up chronic conditions Angeline Laura, NP  "

## 2024-10-26 ENCOUNTER — Encounter: Payer: Self-pay | Admitting: Internal Medicine

## 2024-10-26 ENCOUNTER — Ambulatory Visit: Payer: Self-pay | Admitting: Internal Medicine

## 2024-10-26 LAB — BASIC METABOLIC PANEL WITH GFR
BUN: 21 mg/dL (ref 7–25)
CO2: 27 mmol/L (ref 20–32)
Calcium: 9.4 mg/dL (ref 8.6–10.3)
Chloride: 106 mmol/L (ref 98–110)
Creat: 0.93 mg/dL (ref 0.70–1.22)
Glucose, Bld: 101 mg/dL — ABNORMAL HIGH (ref 65–99)
Potassium: 5 mmol/L (ref 3.5–5.3)
Sodium: 140 mmol/L (ref 135–146)
eGFR: 79 mL/min/1.73m2

## 2024-10-26 NOTE — Assessment & Plan Note (Signed)
 Uncontrolled on losartan  25 mg I would like to obtain potassium level before making medication adjustments.  If potassium remains elevated then I would likely discontinue losartan  and consider amlodipine 10 mg daily Reinforced DASH diet and exercise for weight loss C-Met today

## 2024-10-26 NOTE — Patient Instructions (Signed)
 Hypertension, Adult Hypertension is another name for high blood pressure. High blood pressure forces your heart to work harder to pump blood. This can cause problems over time. There are two numbers in a blood pressure reading. There is a top number (systolic) over a bottom number (diastolic). It is best to have a blood pressure that is below 120/80. What are the causes? The cause of this condition is not known. Some other conditions can lead to high blood pressure. What increases the risk? Some lifestyle factors can make you more likely to develop high blood pressure: Smoking. Not getting enough exercise or physical activity. Being overweight. Having too much fat, sugar, calories, or salt (sodium) in your diet. Drinking too much alcohol. Other risk factors include: Having any of these conditions: Heart disease. Diabetes. High cholesterol. Kidney disease. Obstructive sleep apnea. Having a family history of high blood pressure and high cholesterol. Age. The risk increases with age. Stress. What are the signs or symptoms? High blood pressure may not cause symptoms. Very high blood pressure (hypertensive crisis) may cause: Headache. Fast or uneven heartbeats (palpitations). Shortness of breath. Nosebleed. Vomiting or feeling like you may vomit (nauseous). Changes in how you see. Very bad chest pain. Feeling dizzy. Seizures. How is this treated? This condition is treated by making healthy lifestyle changes, such as: Eating healthy foods. Exercising more. Drinking less alcohol. Your doctor may prescribe medicine if lifestyle changes do not help enough and if: Your top number is above 130. Your bottom number is above 80. Your personal target blood pressure may vary. Follow these instructions at home: Eating and drinking  If told, follow the DASH eating plan. To follow this plan: Fill one half of your plate at each meal with fruits and vegetables. Fill one fourth of your plate  at each meal with whole grains. Whole grains include whole-wheat pasta, brown rice, and whole-grain bread. Eat or drink low-fat dairy products, such as skim milk or low-fat yogurt. Fill one fourth of your plate at each meal with low-fat (lean) proteins. Low-fat proteins include fish, chicken without skin, eggs, beans, and tofu. Avoid fatty meat, cured and processed meat, or chicken with skin. Avoid pre-made or processed food. Limit the amount of salt in your diet to less than 1,500 mg each day. Do not drink alcohol if: Your doctor tells you not to drink. You are pregnant, may be pregnant, or are planning to become pregnant. If you drink alcohol: Limit how much you have to: 0-1 drink a day for women. 0-2 drinks a day for men. Know how much alcohol is in your drink. In the [place] one drink equals one 12 oz bottle of beer (355 mL), one 5 oz glass of wine (148 mL), or one 1 oz glass of hard liquor (44 mL). Lifestyle  Work with your doctor to stay at a healthy weight or to lose weight. Ask your doctor what the best weight is for you. Get at least 30 minutes of exercise that causes your heart to beat faster (aerobic exercise) most days of the week. This may include walking, swimming, or biking. Get at least 30 minutes of exercise that strengthens your muscles (resistance exercise) at least 3 days a week. This may include lifting weights or doing Pilates. Do not smoke or use any products that contain nicotine or tobacco. If you need help quitting, ask your doctor. Check your blood pressure at home as told by your doctor. Keep all follow-up visits. Medicines Take over-the-counter and prescription medicines  only as told by your doctor. Follow directions carefully. Do not skip doses of blood pressure medicine. The medicine does not work as well if you skip doses. Skipping doses also puts you at risk for problems. Ask your doctor about side effects or reactions to medicines that you should watch  for. Contact a doctor if: You think you are having a reaction to the medicine you are taking. You have headaches that keep coming back. You feel dizzy. You have swelling in your ankles. You have trouble with your vision. Get help right away if: You get a very bad headache. You start to feel mixed up (confused). You feel weak or numb. You feel faint. You have very bad pain in your: Chest. Belly (abdomen). You vomit more than once. You have trouble breathing. These symptoms may be an emergency. Get help right away. Call 911. Do not wait to see if the symptoms will go away. Do not drive yourself to the hospital. Summary Hypertension is another name for high blood pressure. High blood pressure forces your heart to work harder to pump blood. For most people, a normal blood pressure is less than 120/80. Making healthy choices can help lower blood pressure. If your blood pressure does not get lower with healthy choices, you may need to take medicine. This information is not intended to replace advice given to you by your health care provider. Make sure you discuss any questions you have with your health care provider. Document Revised: 07/23/2021 Document Reviewed: 07/23/2021 Elsevier Patient Education  2024 [employer].

## 2024-10-29 ENCOUNTER — Encounter: Payer: Self-pay | Admitting: Internal Medicine

## 2024-10-29 MED ORDER — AZITHROMYCIN 250 MG PO TABS: ORAL_TABLET | ORAL | 0 refills | Status: DC

## 2024-11-08 ENCOUNTER — Ambulatory Visit: Admitting: Internal Medicine

## 2024-11-08 ENCOUNTER — Encounter: Payer: Self-pay | Admitting: Internal Medicine

## 2024-11-08 VITALS — BP 140/80 | HR 89 | Ht 72.0 in | Wt 178.0 lb

## 2024-11-08 DIAGNOSIS — I10 Essential (primary) hypertension: Secondary | ICD-10-CM | POA: Diagnosis not present

## 2024-11-08 DIAGNOSIS — R052 Subacute cough: Secondary | ICD-10-CM

## 2024-11-08 NOTE — Patient Instructions (Signed)

## 2024-11-08 NOTE — Progress Notes (Signed)
 "  Subjective:    Patient ID: Jacob Gray, male    DOB: [birth year], 89 y.o.   MRN: [id number]  HPI  Discussed the use of AI scribe software for clinical note transcription with the patient, who gave verbal consent to proceed.  Jacob Gray is an 89 year old male with hypertension who presents with a persistent cough.  He has experienced a persistent cough for the past two weeks, initially treated with azithromycin  for an upper respiratory infection, but the cough persists and is sometimes productive. The severity of the cough has caused him to sit up all night due to fear of not being able to breathe. He describes episodes where he 'couldn't breathe' and felt 'awful'. The cough sometimes disrupts his sleep.  All of his other symptoms have improved but the cough.  He denies current shortness of breath but feels weak and has not been eating or taking his medication properly due to the illness. He has been using over-the-counter cough drops and syrup, which have not been very effective. This is the first time he has been out of the house in two weeks.  He has a history of hypertension and was previously on losartan  25 mg. His potassium levels were high, but upon rechecking, they were normal, allowing for an increase in losartan  to 25 mg twice daily. He has been taking the medication once in the morning and once at night. He can usually tell when his blood pressure is high as he feels 'off'. He does not currently feel this way.  His BP today is 144/84.       Review of Systems     No past medical history on file.  Current Outpatient Medications  Medication Sig Dispense Refill   ASPIRIN 81 PO Take by mouth.     azithromycin  (ZITHROMAX ) 250 MG tablet Take 2 tabs today, then 1 tab daily x 4 days 6 tablet 0   finasteride (PROSCAR) 5 MG tablet      Glucosamine HCl (GLUCOSAMINE PO) Take by mouth 2 (two) times daily.     lactulose  (CEPHULAC ) 10 g packet Take 1 packet (10 g total)  by mouth 2 (two) times daily. Use as needed for severe constipation. 15 each 0   losartan  (COZAAR ) 25 MG tablet TAKE 1 TABLET (25 MG TOTAL) BY MOUTH DAILY. 90 tablet 1   Multiple Vitamin (MULTIVITAMIN) tablet Take 1 tablet by mouth daily.     omeprazole  (PRILOSEC) 20 MG capsule TAKE 1 CAPSULE BY MOUTH EVERY DAY 90 capsule 1   No current facility-administered medications for this visit.    No Known Allergies  No family history on file.  Social History   Socioeconomic History   Marital status: Married    Spouse name: Tanya Marvin   Number of children: 1   Years of education: Not on file   Highest education level: Associate degree: academic program  Occupational History   Occupation: Product/process Development Scientist   Occupation: Retired  Tobacco Use   Smoking status: Never   Smokeless tobacco: Current    Types: Snuff  Vaping Use   Vaping status: Never Used  Substance and Sexual Activity   Alcohol use: Yes    Alcohol/week: 0.0 standard drinks of alcohol    Comment: rarely   Drug use: No   Sexual activity: Yes  Other Topics Concern   Not on file  Social History Narrative   Has a living will- would not desire prolonged life support if futile.  Social Drivers of Health   Tobacco Use: High Risk (10/26/2024)   Patient History    Smoking Tobacco Use: Never    Smokeless Tobacco Use: Current    Passive Exposure: Not on file  Financial Resource Strain: Low Risk (09/23/2023)   Overall Financial Resource Strain (CARDIA)    Difficulty of Paying Living Expenses: Not hard at all  Food Insecurity: Low Risk (10/03/2023)   Received from [facility]   Epic    Within the past 12 months, you worried that your food would run out before you got money to buy more: Never true    Within the past 12 months, the food you bought just didn't last and you didn't have money to get more. : Never true  Transportation Needs: No Transportation Needs (10/03/2023)   Received from [facility]     In the past 12 months, has lack of reliable transportation kept you from medical appointments, meetings, work or from getting things needed for daily living? : No  Physical Activity: Sufficiently Active (09/23/2023)   Exercise Vital Sign    Days of Exercise per Week: 6 days    Minutes of Exercise per Session: 60 min  Stress: No Stress Concern Present (09/23/2023)   [employer] of Occupational Health - Occupational Stress Questionnaire    Feeling of Stress : Not at all  Social Connections: Moderately Integrated (09/23/2023)   Social Connection and Isolation Panel    Frequency of Communication with Friends and Family: More than three times a week    Frequency of Social Gatherings with Friends and Family: More than three times a week    Attends Religious Services: More than 4 times per year    Active Member of [employer] or Organizations: No    Attends Banker Meetings: Never    Marital Status: Married  Catering Manager Violence: Not At Risk (09/23/2023)   Humiliation, Afraid, Rape, and Kick questionnaire    Fear of Current or Ex-Partner: No    Emotionally Abused: No    Physically Abused: No    Sexually Abused: No  Depression (PHQ2-9): Low Risk (10/25/2024)   Depression (PHQ2-9)    PHQ-2 Score: 0  Alcohol Screen: Low Risk (09/23/2023)   Alcohol Screen    Last Alcohol Screening Score (AUDIT): 4  Housing: Low Risk (10/03/2023)   Received from [facility]   Epic    What is your living situation today?: I have a steady place to live    Think about the place you live. Do you have problems with any of the following? Choose all that apply:: None/None on this list  Utilities: Low Risk (10/03/2023)   Received from [facility]   Utilities    In the past 12 months has the electric, gas, oil, or water company threatened to shut off services in your home? : No  Health Literacy: Adequate Health Literacy (09/23/2023)   B1300 Health Literacy    Frequency of need for help with  medical instructions: Never     Constitutional: Denies fever, malaise, fatigue, headache or abrupt weight changes.  HEENT: Denies eye pain, eye redness, ear pain, ringing in the ears, wax buildup, runny nose, nasal congestion, bloody nose, or sore throat. Respiratory: Pt reports cough. Denies difficulty breathing, shortness of breath, or sputum production.   Cardiovascular: Denies chest pain, chest tightness, palpitations or swelling in the hands or feet.  Gastrointestinal: Denies abdominal pain, bloating, constipation, diarrhea or blood in the stool.  GU: Denies  urgency, frequency, pain with urination, burning sensation, blood in urine, odor or discharge. Musculoskeletal: Patient reports weakness, joint pain.  Denies decrease in range of motion, difficulty with gait, muscle pain or joint swelling.  Skin: Denies redness, rashes, lesions or ulcercations.  Neurological: Denies dizziness, difficulty with memory, difficulty with speech or problems with balance and coordination.  Psych: Patient reports grief.  Denies anxiety, depression, SI/HI.  No other specific complaints in a complete review of systems (except as listed in HPI above).  Objective:   Physical Exam BP (!) 144/84 (BP Location: Left Arm, Patient Position: Sitting, Cuff Size: Normal)   Pulse 89   Ht 6' (1.829 m)   Wt 178 lb (80.7 kg)   SpO2 99%   BMI 24.14 kg/m    Wt Readings from Last 3 Encounters:  10/25/24 178 lb (80.7 kg)  07/17/24 174 lb 9.6 oz (79.2 kg)  01/24/24 174 lb 3.2 oz (79 kg)    General: Appears his stated age, well developed, well nourished in NAD. Skin: Warm, dry and intact.  HEENT: Head: normal shape and size; Eyes: sclera white, no icterus, conjunctiva pink, PERRLA and EOMs intact;  Cardiovascular: Bradycardic with normal rhythm. S1,S2 noted.  No murmur, rubs or gallops noted. No JVD or BLE edema.  Pulmonary/Chest: Normal effort and positive vesicular breath sounds. No respiratory distress. No  wheezes, rales or ronchi noted.  Musculoskeletal:  No difficulty with gait.  Neurological: Alert and oriented. Coordination normal.  Psychiatric: Mood and affect normal. Behavior is normal. Judgment and thought content normal.    BMET    Component Value Date/Time   NA 140 10/25/2024 1556   K 5.0 10/25/2024 1556   CL 106 10/25/2024 1556   CO2 27 10/25/2024 1556   GLUCOSE 101 (H) 10/25/2024 1556   BUN 21 10/25/2024 1556   CREATININE 0.93 10/25/2024 1556   CALCIUM 9.4 10/25/2024 1556    Lipid Panel     Component Value Date/Time   CHOL 161 07/17/2024 1530   TRIG 90 07/17/2024 1530   HDL 39 (L) 07/17/2024 1530   CHOLHDL 4.1 07/17/2024 1530   VLDL 17.0 07/08/2020 1546   LDLCALC 104 (H) 07/17/2024 1530    CBC    Component Value Date/Time   WBC 6.0 07/17/2024 1530   RBC 5.02 07/17/2024 1530   HGB 14.4 07/17/2024 1530   HCT 44.0 07/17/2024 1530   PLT 240 07/17/2024 1530   MCV 87.6 07/17/2024 1530   MCH 28.7 07/17/2024 1530   MCHC 32.7 07/17/2024 1530   RDW 12.8 07/17/2024 1530   LYMPHSABS 1,140 04/18/2019 1258   MONOABS 0.4 09/27/2016 1425   EOSABS 39 04/18/2019 1258   BASOSABS 31 04/18/2019 1258    Hgb A1C Lab Results  Component Value Date   HGBA1C 5.6 07/17/2024          Assessment & Plan:  Assessment and Plan    Subacute cough Persisting for two weeks, treated with azithromycin . Differential includes pneumonia or bronchitis. Cough may last 4-6 weeks. - Continue over-the-counter cough suppressants. - Report if symptoms do not improve.  Primary hypertension Blood pressure improved to 144/84 mmHg with losartan . Target is below 130/80 mmHg to reduce cardiovascular risk. Recent illness and coughing may elevate blood pressure. - Continue losartan  25 mg twice daily. - Monitor blood pressure at home once daily at random times. - Report if blood pressure is consistently over 130/80 mmHg.        RTC in 2 months follow-up chronic conditions Angeline Laura,  NP  "

## 2024-11-09 ENCOUNTER — Ambulatory Visit: Admitting: Internal Medicine

## 2025-01-15 ENCOUNTER — Ambulatory Visit: Admitting: Internal Medicine
